# Patient Record
Sex: Male | Born: 1950 | Race: White | Hispanic: No | Marital: Single | Smoking: Current every day smoker
Health system: Southern US, Community
[De-identification: ages and names within clinical notes are randomized; demographics above are authoritative.]

## PROBLEM LIST (undated history)

## (undated) DIAGNOSIS — I10 Essential (primary) hypertension: Secondary | ICD-10-CM

## (undated) DIAGNOSIS — M51369 Other intervertebral disc degeneration, lumbar region without mention of lumbar back pain or lower extremity pain: Secondary | ICD-10-CM

## (undated) DIAGNOSIS — F419 Anxiety disorder, unspecified: Secondary | ICD-10-CM

## (undated) DIAGNOSIS — M5136 Other intervertebral disc degeneration, lumbar region: Secondary | ICD-10-CM

## (undated) DIAGNOSIS — T8859XA Other complications of anesthesia, initial encounter: Secondary | ICD-10-CM

## (undated) DIAGNOSIS — E611 Iron deficiency: Secondary | ICD-10-CM

## (undated) DIAGNOSIS — E7800 Pure hypercholesterolemia, unspecified: Secondary | ICD-10-CM

## (undated) DIAGNOSIS — M549 Dorsalgia, unspecified: Secondary | ICD-10-CM

## (undated) DIAGNOSIS — K759 Inflammatory liver disease, unspecified: Secondary | ICD-10-CM

## (undated) DIAGNOSIS — M5430 Sciatica, unspecified side: Secondary | ICD-10-CM

## (undated) HISTORY — PX: JOINT REPLACEMENT: SHX530

## (undated) HISTORY — PX: TOTAL HIP ARTHROPLASTY: SHX124

---

## 2013-09-30 ENCOUNTER — Ambulatory Visit: Payer: Self-pay | Admitting: General Practice

## 2013-10-21 ENCOUNTER — Ambulatory Visit: Payer: Self-pay | Admitting: General Practice

## 2013-10-21 LAB — BASIC METABOLIC PANEL
Anion Gap: 4 — ABNORMAL LOW (ref 7–16)
BUN: 16 mg/dL (ref 7–18)
CO2: 36 mmol/L — AB (ref 21–32)
Calcium, Total: 8.9 mg/dL (ref 8.5–10.1)
Chloride: 93 mmol/L — ABNORMAL LOW (ref 98–107)
Creatinine: 0.84 mg/dL (ref 0.60–1.30)
EGFR (African American): >60
EGFR (Non-African Amer.): >60
Glucose: 121 mg/dL — ABNORMAL HIGH (ref 65–99)
OSMOLALITY: 269 (ref 275–301)
Potassium: 3.2 mmol/L — ABNORMAL LOW (ref 3.5–5.1)
SODIUM: 133 mmol/L — AB (ref 136–145)

## 2013-10-21 LAB — URINALYSIS, COMPLETE
BLOOD: NEGATIVE
Bacteria: NONE SEEN
Bilirubin,UR: NEGATIVE
Glucose,UR: NEGATIVE mg/dL (ref 0–75)
Hyaline Cast: 1
Ketone: NEGATIVE
Leukocyte Esterase: NEGATIVE
NITRITE: NEGATIVE
PROTEIN: NEGATIVE
Ph: 6 (ref 4.5–8.0)
RBC,UR: 1 /HPF (ref 0–5)
Specific Gravity: 1.014 (ref 1.003–1.030)
Squamous Epithelial: NONE SEEN
WBC UR: 1 /HPF (ref 0–5)

## 2013-10-21 LAB — SEDIMENTATION RATE: ERYTHROCYTE SED RATE: 22 mm/h — AB (ref 0–20)

## 2013-10-21 LAB — CBC
HCT: 43.7 % (ref 40.0–52.0)
HGB: 14.5 g/dL (ref 13.0–18.0)
MCH: 34.8 pg — ABNORMAL HIGH (ref 26.0–34.0)
MCHC: 33.2 g/dL (ref 32.0–36.0)
MCV: 105 fL — ABNORMAL HIGH (ref 80–100)
PLATELETS: 275 10*3/uL (ref 150–440)
RBC: 4.17 10*6/uL — AB (ref 4.40–5.90)
RDW: 14.1 % (ref 11.5–14.5)
WBC: 5.6 10*3/uL (ref 3.8–10.6)

## 2013-10-21 LAB — PROTIME-INR
INR: 0.9
PROTHROMBIN TIME: 12 s (ref 11.5–14.7)

## 2013-10-21 LAB — APTT: ACTIVATED PTT: 33.3 s (ref 23.6–35.9)

## 2013-10-21 LAB — MRSA PCR SCREENING

## 2013-10-22 LAB — URINE CULTURE

## 2013-11-06 ENCOUNTER — Inpatient Hospital Stay: Payer: Self-pay | Admitting: General Practice

## 2013-11-06 LAB — POTASSIUM: Potassium: 3.1 mmol/L — ABNORMAL LOW (ref 3.5–5.1)

## 2013-11-07 LAB — BASIC METABOLIC PANEL
BUN: 8 mg/dL (ref 7–18)
CO2: 31 mmol/L (ref 21–32)
CREATININE: 0.68 mg/dL (ref 0.60–1.30)
Calcium, Total: 8.6 mg/dL (ref 8.5–10.1)
Chloride: 103 mmol/L (ref 98–107)
EGFR (African American): >60
EGFR (Non-African Amer.): >60
Glucose: 111 mg/dL — ABNORMAL HIGH (ref 65–99)
OSMOLALITY: 264 (ref 275–301)
Potassium: 3.5 mmol/L (ref 3.5–5.1)
SODIUM: 132 mmol/L — AB (ref 136–145)

## 2013-11-07 LAB — HEMOGLOBIN: HGB: 11.4 g/dL — AB (ref 13.0–18.0)

## 2013-11-07 LAB — PLATELET COUNT: Platelet: 243 10*3/uL (ref 150–440)

## 2013-11-08 LAB — HEMOGLOBIN: HGB: 11.1 g/dL — ABNORMAL LOW (ref 13.0–18.0)

## 2013-11-08 LAB — BASIC METABOLIC PANEL
Anion Gap: 4 — ABNORMAL LOW (ref 7–16)
BUN: 10 mg/dL (ref 7–18)
CREATININE: 0.8 mg/dL (ref 0.60–1.30)
Calcium, Total: 8.4 mg/dL — ABNORMAL LOW (ref 8.5–10.1)
Chloride: 100 mmol/L (ref 98–107)
Co2: 33 mmol/L — ABNORMAL HIGH (ref 21–32)
Glucose: 110 mg/dL — ABNORMAL HIGH (ref 65–99)
Osmolality: 274 (ref 275–301)
Potassium: 3.2 mmol/L — ABNORMAL LOW (ref 3.5–5.1)
Sodium: 137 mmol/L (ref 136–145)

## 2013-11-08 LAB — PLATELET COUNT: PLATELETS: 225 10*3/uL (ref 150–440)

## 2013-11-09 LAB — BASIC METABOLIC PANEL
ANION GAP: 6 — AB (ref 7–16)
BUN: 8 mg/dL (ref 7–18)
CALCIUM: 9.1 mg/dL (ref 8.5–10.1)
CHLORIDE: 98 mmol/L (ref 98–107)
CREATININE: 0.84 mg/dL (ref 0.60–1.30)
Co2: 30 mmol/L (ref 21–32)
EGFR (Non-African Amer.): >60
GLUCOSE: 98 mg/dL (ref 65–99)
Osmolality: 267 (ref 275–301)
POTASSIUM: 3.9 mmol/L (ref 3.5–5.1)
SODIUM: 134 mmol/L — AB (ref 136–145)

## 2013-11-11 LAB — PATHOLOGY REPORT

## 2014-05-21 ENCOUNTER — Ambulatory Visit: Payer: Self-pay | Admitting: Internal Medicine

## 2014-06-28 ENCOUNTER — Ambulatory Visit: Payer: Self-pay | Admitting: Gastroenterology

## 2014-10-30 NOTE — Discharge Summary (Signed)
PATIENT NAME:  Todd Beard, Todd Beard MR#:  [id number] DATE OF BIRTH:  11-27-1950  DATE OF ADMISSION:  11/06/2013 DATE OF DISCHARGE:  11/09/2013  ADMITTING DIAGNOSIS: Degenerative arthrosis of the left hip.   DISCHARGE DIAGNOSIS: Degenerative arthrosis of the right knee.   HISTORY: The patient is a 64 year old who has been followed at [facility] for progression of left hip and groin pain. The pain has become relatively constant in nature and was noted be worse with weight-bearing activities. The patient had appreciated significant decrease in his range of motion. At the time of surgery, he was using a quad cane for ambulation due to the severity of pain. He had gotten to the point where he was not seeing any appreciative benefits from diclofenac or tramadol and states that he was actually requiring hydrocodone for pain management. The patient states that the pain had progressed to the point that it was significantly interfering with his activities of daily living. X-rays taken in [facility] showed severe erosion changes noted to the left hip with collapse in the femoral head. After discussion of the risks and benefits of surgical intervention, the patient expressed his understanding of the risks and benefits and agreed for plans for surgical intervention.   HOSPITAL COURSE:   PROCEDURE: Left total hip arthroplasty.   ANESTHESIA: Spinal.   IMPLANTS UTILIZED: DePuy 15 mm small stature AML femoral stem, 56 mm outer diameter Pinnacle Gription sector acetabular shell, two 6.5 mm Pinnacle cancellus screws (20 mm and 25 mm length), neutral Pinnacle Marathon polyethylene liner, and a 36 mm M-Spec femoral head with a -2 mm neck length.   HOSPITAL COURSE: The patient tolerated the procedure very well. He had no complications. He was then taken to the PAC-U where he was stabilized and then transferred to the orthopedic floor. The patient began receiving anticoagulation therapy of Lovenox 30 mg  subcutaneous every 12 hours per anesthesia and pharmacy protocol. He was fitted with TED stockings bilaterally. These were allowed to be removed one hour per eight hour shift. The patient was also fitted with AVI compression foot pumps bilaterally set at 80 mmHg. His calves have been nontender. There has been no evidence of any DVTs. Heels were elevated off the bed using rolled towels.   The patient has denied any chest pain or shortness of breath. Vital signs have been stable. He has been afebrile. Hemodynamically,  he was stable. No transfusions were given.   Physical therapy was initiated on day one for gait training and transfers. He has done very well. Upon being discharged, he was ambulating greater than 200 feet. Was independent with bed to chair transfers. Was able go up and down four sets of steps. Occupational therapy was also initiated on day one for activities of daily living and assistive devices. Overall, unremarkable hospital course. He has done extremely well.   The patient's IV, Foley and Hemovac were discontinued on day two along with a dressing change. The wound was free of any drainage or any signs of infection.   DISPOSITION: The patient is being discharged to home in improved stable condition.   DISCHARGE INSTRUCTIONS: He may continue to weight bear as tolerated. Continue with TED stockings bilaterally. These are allowed to be removed at night but are to be worn during the day. Elevate the heels off the bed. Incentive spirometer q.1 while awake. Encourage the patient to continue cough and deep breathing q.2 hours while awake. He is placed on a regular diet. He is instructed in  wound care. He is not to get the dressing wet until staples are removed on 11/20/2013. Physical therapy will remove the staples on this date and apply benzoin  and Steri-Strips. He has a follow-up appointment with [facility] on 06/16 at 9:30. He is to call the clinic sooner if any temperatures of 101.5 or  greater. He was once again gone over the posterior hip precautions. Continue using a walker until cleared by physical therapy to go to a quad cane.   DRUG ALLERGIES: ATENOLOL AND METOPROLOL.   The patient is to resume his regular medications that he was on prior to admission. He was given a prescription for oxycodone 5 to 10 mg q.4-6h.  p.r.n. for pain, tramadol 50 to 100 mg q.4-6h. p.r.n. for pain, and Lovenox 40 mg subcutaneously daily for 14 days, then discontinue and begin taking  one 81 mg enteric-coated aspirin.   PAST MEDICAL HISTORY: Hypertension.  ____________________________ Vance Peper, PA jrw:sg D: 11/09/2013 07:02:55 ET T: 11/09/2013 09:16:07 ET JOB#: [id number]  cc: Vance Peper, PA, <Dictator> JON WOLFE PA ELECTRONICALLY SIGNED 11/10/2013 21:42

## 2014-10-30 NOTE — Op Note (Signed)
PATIENT NAME:  Todd Beard, Todd Beard MR#:  [id number] DATE OF BIRTH:  [birth year]  DATE OF PROCEDURE:  11/06/2013  PREOPERATIVE DIAGNOSIS: Severe degenerative arthrosis of the left hip.   POSTOPERATIVE DIAGNOSIS: Severe degenerative arthrosis of the left hip.   PROCEDURE PERFORMED: Left total hip arthroplasty.   SURGEON: Laurice Record. Holley Bouche., MD  ASSISTANT: Vance Peper, PA (required to maintain retraction throughout the procedure).   ANESTHESIA: Spinal.   ESTIMATED BLOOD LOSS: 500 mL.   FLUIDS REPLACED: 1200 mL of crystalloid.   DRAINS: Two medium drains to Hemovac reservoir.   IMPLANTS UTILIZED: DePuy 15 mm small stature AML femoral stem, 56 mm outer diameter Pinnacle Gription Sector acetabular shell, two 6.5 mm Pinnacle cancellous bone screws (20 mm and 25 mm length), neutral Pinnacle Marathon polyethylene liner, and 36 mm M-SPEC femoral head with a -2 mm neck length.   INDICATIONS FOR SURGERY: The patient is a 64 year old male who has been seen for complaints of progressive left hip and groin pain with limb length inequality. X-rays demonstrated severe degenerative changes with collapse of the femoral head and erosion of the acetabulum. After discussion of the risks and benefits of surgical intervention, the patient expressed understanding of the risks and benefits and agreed with plans for surgical intervention.   PROCEDURE IN DETAIL: The patient was brought into the operating room and, after adequate spinal anesthesia was achieved, the patient was placed in right lateral decubitus position. Axillary roll was placed, and all bony prominences were well padded. The patient's left hip and leg were cleaned and prepped with alcohol and DuraPrep and draped in the usual sterile fashion. A "timeout" was performed as per usual protocol. A lateral curvilinear incision was made gently curving towards the posterior superior iliac spine. IT band was incised in line with the skin incisions. Fibers of the  gluteus maximus were split in line. Piriformis tendon was identified, skeletonized, incised at its insertion in the proximal femur and reflected posteriorly. In a similar fashion, the short external rotators were incised and reflected posteriorly. A T-type posterior capsulotomy was performed. Prior to dislocation of the femoral head, a threaded Steinmann pin was inserted through a separate stab incision into the pelvis superior to the acetabulum and bent in the form of a stylus so as to assess limb length and hip offset throughout the procedure. The femoral head was then dislocated posteriorly. Inspection of the head demonstrated severe degenerative changes with gross collapse and irregularity of the remaining portion of the articular surface. The femoral neck cut was performed using an oscillating saw. The anterior capsule was elevated off of the femoral neck. Inspection of the acetabulum demonstrated gross irregularity with fibrotic tissue within the recess of the acetabulum. The labrum as well as the fibrotic tissue within the acetabulum was debrided and excised using electrocautery. The acetabulum was then carefully reamed in a sequential fashion up to a 55 mm diameter. This allowed for good punctate bleeding bone as well as a relatively solid surface. A 56 mm outer diameter Pinnacle Gription Sector acetabular shell was positioned and impacted into place. Good scratch fit was appreciated. Additional stabilization of the cup was performed using insertion of two 6.5 mm cancellous screws. A neutral polyethylene liner was inserted, and attention was directed to the proximal femur. Pilot hole for reaming of the proximal femoral canal was created using a high-speed bur. Proximal femoral canal was reamed in a sequential fashion using serial reamers up to a 14.5 mm diameter. This allowed for approximately 5.5  to 6 cm of scratch fit. The proximal femur was then further prepared using a 50 mm aggressive side-biting  reamer. Serial broaches were inserted up to a 15 mm small stature broach. The calcar region was planed, and trial reduction was attempted with first a 1.5 and subsequently a -2 mm neck length. It was felt that the tension was too tight to provide for appropriate reduction, and it was thus elected to countersink the broach and remove additional bone in the calcar region. This was performed, and the calcar region was planed. Trial reduction was performed with a 36 mm hip ball with a -2 mm neck length. This allowed for excellent stability both anteriorly and posteriorly and for appropriate hip offset and good equalization of limb lengths. The trial components were removed. The acetabular shell was irrigated with copious amounts of normal saline with antibiotic solution and then suctioned dry. A neutral Pinnacle Marathon polyethylene insert was positioned and impacted into place. Next, a 15 mm small stature AML femoral component was positioned and impacted into place. Excellent scratch fit was appreciated. The Morse taper was cleaned and dried. A 36 mm M-SPEC femoral head with a -2 mm neck length was placed on the trunnion and impacted into place. The hip was reduced and placed through a range of motion. Excellent range of motion was appreciated both anteriorly and posteriorly. Good equalization of limb lengths was appreciated and good hip offset was noted. The sciatic nerve was palpated and was felt to have no excessive tension applied in positions of flexion or extension.   The wound was irrigated with copious amounts of normal saline with antibiotic solution using pulsatile lavage and then suctioned dry. The posterior capsulotomy was repaired using #5 Ethibond. The piriformis tendon was reapproximated on the surface of the gluteus medius tendon using #5 Ethibond. The gluteal sling was repaired using interrupted sutures of #5 Ethibond. Two medium drains were placed in the wound bed and brought out through separate  stab incision to be attached to a Hemovac reservoir. IT band was repaired using interrupted sutures of #1 Vicryl. The subcutaneous tissue was approximated in layers using first #0 Vicryl, followed by 2-0 Vicryl. Skin was closed with skin staples. A sterile dressing was applied.   The patient tolerated the procedure well. He was transported to the recovery room in stable condition.    ____________________________ Laurice Record. Holley Bouche., MD jph:lb D: 11/06/2013 13:12:52 ET T: 11/06/2013 13:31:07 ET JOB#: [id number]  cc: Jeneen Rinks P. Holley Bouche., MD, <Dictator> JAMES P Holley Bouche MD ELECTRONICALLY SIGNED 11/15/2013 23:08

## 2014-11-18 ENCOUNTER — Emergency Department: Payer: No Typology Code available for payment source

## 2014-11-18 ENCOUNTER — Emergency Department
Admission: EM | Admit: 2014-11-18 | Discharge: 2014-11-18 | Disposition: A | Discharge status: Home / Self Care | Payer: No Typology Code available for payment source | Attending: Emergency Medicine | Admitting: Emergency Medicine

## 2014-11-18 DIAGNOSIS — Y93E1 Activity, personal bathing and showering: Secondary | ICD-10-CM | POA: Diagnosis not present

## 2014-11-18 DIAGNOSIS — Y9289 Other specified places as the place of occurrence of the external cause: Secondary | ICD-10-CM | POA: Insufficient documentation

## 2014-11-18 DIAGNOSIS — I10 Essential (primary) hypertension: Secondary | ICD-10-CM | POA: Insufficient documentation

## 2014-11-18 DIAGNOSIS — Y998 Other external cause status: Secondary | ICD-10-CM | POA: Diagnosis not present

## 2014-11-18 DIAGNOSIS — Z7982 Long term (current) use of aspirin: Secondary | ICD-10-CM | POA: Insufficient documentation

## 2014-11-18 DIAGNOSIS — Z720 Tobacco use: Secondary | ICD-10-CM | POA: Diagnosis not present

## 2014-11-18 DIAGNOSIS — W01198A Fall on same level from slipping, tripping and stumbling with subsequent striking against other object, initial encounter: Secondary | ICD-10-CM | POA: Insufficient documentation

## 2014-11-18 DIAGNOSIS — Z79899 Other long term (current) drug therapy: Secondary | ICD-10-CM | POA: Diagnosis not present

## 2014-11-18 DIAGNOSIS — S299XXA Unspecified injury of thorax, initial encounter: Secondary | ICD-10-CM | POA: Diagnosis present

## 2014-11-18 DIAGNOSIS — T1490XA Injury, unspecified, initial encounter: Secondary | ICD-10-CM

## 2014-11-18 DIAGNOSIS — S2241XA Multiple fractures of ribs, right side, initial encounter for closed fracture: Secondary | ICD-10-CM | POA: Insufficient documentation

## 2014-11-18 HISTORY — DX: Essential (primary) hypertension: I10

## 2014-11-18 LAB — COMPREHENSIVE METABOLIC PANEL
ALBUMIN: 3.7 g/dL (ref 3.5–5.0)
ALK PHOS: 218 U/L — AB (ref 38–126)
ALT: 48 U/L (ref 17–63)
AST: 128 U/L — ABNORMAL HIGH (ref 15–41)
Anion gap: 13 (ref 5–15)
BUN: 14 mg/dL (ref 6–20)
CALCIUM: 9.8 mg/dL (ref 8.9–10.3)
CO2: 28 mmol/L (ref 22–32)
Chloride: 95 mmol/L — ABNORMAL LOW (ref 101–111)
Creatinine, Ser: 1.09 mg/dL (ref 0.61–1.24)
GFR calc Af Amer: >60 mL/min (ref >60)
Glucose, Bld: 132 mg/dL — ABNORMAL HIGH (ref 65–99)
POTASSIUM: 4.2 mmol/L (ref 3.5–5.1)
SODIUM: 136 mmol/L (ref 135–145)
TOTAL PROTEIN: 8.7 g/dL — AB (ref 6.5–8.1)
Total Bilirubin: 1.4 mg/dL — ABNORMAL HIGH (ref 0.3–1.2)

## 2014-11-18 LAB — CBC WITH DIFFERENTIAL/PLATELET
BASOS ABS: 0.1 10*3/uL (ref 0–0.1)
Basophils Relative: 1 %
Eosinophils Absolute: 0 10*3/uL (ref 0–0.7)
Eosinophils Relative: 0 %
HCT: 47.1 % (ref 40.0–52.0)
HEMOGLOBIN: 15.6 g/dL (ref 13.0–18.0)
LYMPHS PCT: 12 %
Lymphs Abs: 1.9 10*3/uL (ref 1.0–3.6)
MCH: 34.5 pg — ABNORMAL HIGH (ref 26.0–34.0)
MCHC: 33.2 g/dL (ref 32.0–36.0)
MCV: 103.7 fL — ABNORMAL HIGH (ref 80.0–100.0)
Monocytes Absolute: 1.5 10*3/uL — ABNORMAL HIGH (ref 0.2–1.0)
Monocytes Relative: 10 %
NEUTROS ABS: 12.2 10*3/uL — AB (ref 1.4–6.5)
NEUTROS PCT: 77 %
PLATELETS: 318 10*3/uL (ref 150–440)
RBC: 4.54 MIL/uL (ref 4.40–5.90)
RDW: 16.2 % — AB (ref 11.5–14.5)
WBC: 15.7 10*3/uL — ABNORMAL HIGH (ref 3.8–10.6)

## 2014-11-18 LAB — LIPASE, BLOOD: Lipase: 32 U/L (ref 22–51)

## 2014-11-18 MED ORDER — ONDANSETRON HCL 4 MG/2ML IJ SOLN
INTRAMUSCULAR | Status: AC
  Administered 2014-11-18: 4 mg via INTRAVENOUS
  Filled 2014-11-18: qty 2

## 2014-11-18 MED ORDER — IOHEXOL 350 MG/ML SOLN
100.0000 mL | Freq: Once | INTRAVENOUS | Status: AC | PRN
  Administered 2014-11-18: 100 mL via INTRAVENOUS
  Filled 2014-11-18: qty 100

## 2014-11-18 MED ORDER — MORPHINE SULFATE 4 MG/ML IJ SOLN
INTRAMUSCULAR | Status: AC
  Administered 2014-11-18: 4 mg via INTRAVENOUS
  Filled 2014-11-18: qty 1

## 2014-11-18 MED ORDER — HYDROCODONE-ACETAMINOPHEN 5-325 MG PO TABS
ORAL_TABLET | ORAL | Status: AC
  Filled 2014-11-18: qty 1

## 2014-11-18 MED ORDER — HYDROCODONE-ACETAMINOPHEN 5-325 MG PO TABS: 1.0000 | ORAL_TABLET | Freq: Four times a day (QID) | ORAL | Status: DC | PRN

## 2014-11-18 MED ORDER — HYDROCODONE-ACETAMINOPHEN 5-325 MG PO TABS
1.0000 | ORAL_TABLET | Freq: Once | ORAL | Status: AC
  Administered 2014-11-18: 1 via ORAL

## 2014-11-18 MED ORDER — MORPHINE SULFATE 4 MG/ML IJ SOLN
4.0000 mg | Freq: Once | INTRAMUSCULAR | Status: AC
  Administered 2014-11-18: 4 mg via INTRAVENOUS

## 2014-11-18 MED ORDER — ONDANSETRON HCL 4 MG/2ML IJ SOLN
4.0000 mg | INTRAMUSCULAR | Status: AC
  Administered 2014-11-18: 4 mg via INTRAVENOUS

## 2014-11-18 NOTE — ED Notes (Signed)
Pt sent from [facility] with 3 fractured ribs on the right for possible observations.the patient states he slipped in the shower this morning and injured his right ribs.the patient is a/ox4, respirations wnl.the patient is in NAD on arrival..

## 2014-11-18 NOTE — ED Notes (Signed)
Patient transported to CT 

## 2014-11-18 NOTE — Discharge Instructions (Signed)
As we discussed, you have 3 rib fractures on the right side of your chest.  We did not identify any other injuries on your CT scan.  Please take your pain medicine as needed and use the incentive spirometer as instructed to make sure you continue taking nice deep breaths.  Follow-up with Dr. Doy Hutching next week for a follow-up appointment, or return immediately to the emergency department if you develop new or worsening symptoms.  Rib Fracture A rib fracture is a break or crack in one of the bones of the ribs. The ribs are a group of long, curved bones that wrap around your chest and attach to your spine. They protect your lungs and other organs in the chest cavity. A broken or cracked rib is often painful, but most do not cause other problems. Most rib fractures heal on their own over time. However, rib fractures can be more serious if multiple ribs are broken or if broken ribs move out of place and push against other structures. CAUSES   A direct blow to the chest. For example, this could happen during contact sports, a car accident, or a fall against a hard object.  Repetitive movements with high force, such as pitching a baseball or having severe coughing spells. SYMPTOMS   Pain when you breathe in or cough.  Pain when someone presses on the injured area. DIAGNOSIS  Your caregiver will perform a physical exam. Various imaging tests may be ordered to confirm the diagnosis and to look for related injuries. These tests may include a chest X-ray, computed tomography (CT), magnetic resonance imaging (MRI), or a bone scan. TREATMENT  Rib fractures usually heal on their own in 1-3 months. The longer healing period is often associated with a continued cough or other aggravating activities. During the healing period, pain control is very important. Medication is usually given to control pain. Hospitalization or surgery may be needed for more severe injuries, such as those in which multiple ribs are broken  or the ribs have moved out of place.  HOME CARE INSTRUCTIONS   Avoid strenuous activity and any activities or movements that cause pain. Be careful during activities and avoid bumping the injured rib.  Gradually increase activity as directed by your caregiver.  Only take over-the-counter or prescription medications as directed by your caregiver. Do not take other medications without asking your caregiver first.  Apply ice to the injured area for the first 1-2 days after you have been treated or as directed by your caregiver. Applying ice helps to reduce inflammation and pain.  Put ice in a plastic bag.  Place a towel between your skin and the bag.   Leave the ice on for 15-20 minutes at a time, every 2 hours while you are awake.  Perform deep breathing as directed by your caregiver. This will help prevent pneumonia, which is a common complication of a broken rib. Your caregiver may instruct you to:  Take deep breaths several times a day.  Try to cough several times a day, holding a pillow against the injured area.  Use a device called an incentive spirometer to practice deep breathing several times a day.  Drink enough fluids to keep your urine clear or pale yellow. This will help you avoid constipation.   Do not wear a rib belt or binder. These restrict breathing, which can lead to pneumonia.  SEEK IMMEDIATE MEDICAL CARE IF:   You have a fever.   You have difficulty breathing or shortness of  breath.   You develop a continual cough, or you cough up thick or bloody sputum.  You feel sick to your stomach (nausea), throw up (vomit), or have abdominal pain.   You have worsening pain not controlled with medications.  MAKE SURE YOU:  Understand these instructions.  Will watch your condition.  Will get help right away if you are not doing well or get worse. Document Released: 06/25/2005 Document Revised: 02/25/2013 Document Reviewed: 08/27/2012 [facility] Patient  Information 2015 [place], [place]. This information is not intended to replace advice given to you by your health care provider. Make sure you discuss any questions you have with your health care provider.

## 2014-11-18 NOTE — ED Provider Notes (Signed)
[facility] Emergency Department Provider Note  ____________________________________________  Time seen: Approximately 5:57 PM  I have reviewed the triage vital signs and the nursing notes.   HISTORY  Chief Complaint Rib Injury    HPI Todd Beard is a 64 y.o. male with no contributory past medical history who presents upon the recommendation of the [facility] acute care.  He went to them for evaluation after falling early this morning in the shower and striking his right upper rib cage.  He denies difficulty breathing but is having persistent pain on the right side of his body.  The chest x-ray, which we unfortunately cannot see in [facility], wasnotable for 3 fractured ribs on the right.  The [facility] recommended that he come to the emergency department for possible 24-hour evaluation.  However, the patient is not in significant pain and is having no difficulty breathing.  He describes the pain as moderate and worse with movement.  Holding still makes it better.   Past Medical History  Diagnosis Date  . Hypertension     There are no active problems to display for this patient.   Past Surgical History  Procedure Laterality Date  . Total hip arthroplasty Left     Current Outpatient Rx  Name  Route  Sig  Dispense  Refill  . ALPRAZolam (XANAX) 1 MG tablet   Oral   Take 1 mg by mouth 2 (two) times daily as needed for anxiety.         Marland Kitchen amLODipine (NORVASC) 5 MG tablet   Oral   Take 5 mg by mouth daily.         Marland Kitchen aspirin 81 MG tablet   Oral   Take 81 mg by mouth daily.         . cyclobenzaprine (FLEXERIL) 10 MG tablet   Oral   Take 10 mg by mouth 3 (three) times daily.         . magnesium oxide (MAG-OX) 400 MG tablet   Oral   Take 400 mg by mouth daily.         . potassium chloride (K-DUR) 10 MEQ tablet   Oral   Take 10 mEq by mouth daily.           Allergies Tramadol  No family history on file.  Social  History History  Substance Use Topics  . Smoking status: Current Every Day Smoker -- 0.50 packs/day    Types: Cigarettes  . Smokeless tobacco: Never Used  . Alcohol Use: Yes     Comment: occasion    Review of Systems Constitutional: No fever/chills Eyes: No visual changes. ENT: No sore throat. Cardiovascular: Denies chest pain. Respiratory: Denies shortness of breath.  Pain in the right side of his chest. Gastrointestinal: No abdominal pain.  No nausea, no vomiting.  No diarrhea.  No constipation. Genitourinary: Negative for dysuria. Musculoskeletal: Negative for back pain. Skin: Negative for rash. Neurological: Negative for headaches, focal weakness or numbness.  10-point ROS otherwise negative.  ____________________________________________   PHYSICAL EXAM:  VITAL SIGNS: ED Triage Vitals  Enc Vitals Group     BP 11/18/14 1513 141/90 mmHg     Pulse Rate 11/18/14 1513 88     Resp 11/18/14 1513 18     Temp 11/18/14 1513 98.7 F (37.1 C)     Temp Source 11/18/14 1513 Oral     SpO2 11/18/14 1513 100 %     Weight 11/18/14 1513 157 lb (71.215 kg)  Height 11/18/14 1513 5\' 6"  (1.676 m)     Head Cir --      Peak Flow --      Pain Score 11/18/14 1514 10     Pain Loc --      Pain Edu? --      Excl. in [place]? --     Constitutional: Alert and oriented. Well appearing and in no acute distress. Eyes: Conjunctivae are normal. PERRL. EOMI. Head: Atraumatic. Nose: No congestion/rhinnorhea. Mouth/Throat: Mucous membranes are moist.  Oropharynx non-erythematous. Neck: No stridor.  No cervical spine tenderness to palpation. Cardiovascular: Normal rate, regular rhythm. Grossly normal heart sounds.  Good peripheral circulation. Respiratory: Normal respiratory effort.  No retractions. Lungs CTAB.  Ecchymosis of the right lateral upper rib cage with tenderness to palpation. Gastrointestinal: Soft but tender to palpation of the right upper quadrant with guarding. No distention. No  abdominal bruits. No CVA tenderness. Musculoskeletal: No lower extremity tenderness nor edema.  No joint effusions. Neurologic:  Normal speech and language. No gross focal neurologic deficits are appreciated. Speech is normal. No gait instability. Skin:  Skin is warm, dry and intact. No rash noted. Psychiatric: Mood and affect are normal. Speech and behavior are normal.  ____________________________________________   LABS (all labs ordered are listed, but only abnormal results are displayed)  Labs Reviewed  COMPREHENSIVE METABOLIC PANEL - Abnormal; Notable for the following:    Chloride 95 (*)    Glucose, Bld 132 (*)    Total Protein 8.7 (*)    AST 128 (*)    Alkaline Phosphatase 218 (*)    Total Bilirubin 1.4 (*)    All other components within normal limits  CBC WITH DIFFERENTIAL/PLATELET - Abnormal; Notable for the following:    WBC 15.7 (*)    MCV 103.7 (*)    MCH 34.5 (*)    RDW 16.2 (*)    Neutro Abs 12.2 (*)    Monocytes Absolute 1.5 (*)    All other components within normal limits  LIPASE, BLOOD   ____________________________________________  EKG  Not indicated ____________________________________________  RADIOLOGY  Ct Chest W Contrast  11/18/2014   CLINICAL DATA:  [place] in the shower this morning.  Right rib pain.  EXAM: CT CHEST, ABDOMEN, AND PELVIS WITH CONTRAST  TECHNIQUE: Multidetector CT imaging of the chest, abdomen and pelvis was performed following the standard protocol during bolus administration of intravenous contrast.  CONTRAST:  161mL OMNIPAQUE IOHEXOL 350 MG/ML SOLN  COMPARISON:  None.  FINDINGS: CT CHEST FINDINGS  There are acute minimally displaced fractures of the right sixth and seventh and eighth ribs. There is old fracture deformity of the right ninth and tenth ribs. There is old fracture deformity of the left third rib there are mild linear atelectatic appearing opacities in the right lung base. There is no pneumothorax. There is no effusion.  Mediastinum and great vessels are intact. Thoracic aorta is normal in caliber and intact.  CT ABDOMEN AND PELVIS FINDINGS  There is diffuse fatty infiltration of the liver without focal lesion. The liver is moderately enlarged. The spleen is normal in size and unremarkable. Gallbladder and bile ducts are unremarkable.  There are normal appearances of the pancreas. There is a 11 x 16 mm left adrenal nodule with benign appearances. The kidneys, ureters and bladder appear unremarkable.  The abdominal aorta is normal in caliber with moderate atherosclerotic calcification.  Stomach and small bowel appear normal. There is extensive colonic diverticulosis.  There is no peritoneal blood or free  air.  There is mild metal artifact from a left total hip arthroplasty. Skeletal structures appear intact.  IMPRESSION: 1. Acute fractures of the right sixth, seventh and eighth ribs. 2. Old chronic fracture deformities of the right ninth and tenth ribs, as well as the left third rib. 3. Mild linear atelectatic appearing right lung base opacities 4. Enlarged fatty liver 5. 11 x 16 mm left adrenal nodule with benign appearances. Consider a follow-up scan in 12 months to assess stability. This recommendation follows ACR consensus guidelines: Managing Incidental Findings on Abdominal CT: White Paper of the ACR Incidental Findings Committee. Joellyn Rued Radiol [phone]   Electronically Signed   By: Andreas Newport M.D.   On: 11/18/2014 20:04   Ct Abdomen Pelvis W Contrast  11/18/2014   CLINICAL DATA:  [place] in the shower this morning.  Right rib pain.  EXAM: CT CHEST, ABDOMEN, AND PELVIS WITH CONTRAST  TECHNIQUE: Multidetector CT imaging of the chest, abdomen and pelvis was performed following the standard protocol during bolus administration of intravenous contrast.  CONTRAST:  126mL OMNIPAQUE IOHEXOL 350 MG/ML SOLN  COMPARISON:  None.  FINDINGS: CT CHEST FINDINGS  There are acute minimally displaced fractures of the right sixth  and seventh and eighth ribs. There is old fracture deformity of the right ninth and tenth ribs. There is old fracture deformity of the left third rib there are mild linear atelectatic appearing opacities in the right lung base. There is no pneumothorax. There is no effusion. Mediastinum and great vessels are intact. Thoracic aorta is normal in caliber and intact.  CT ABDOMEN AND PELVIS FINDINGS  There is diffuse fatty infiltration of the liver without focal lesion. The liver is moderately enlarged. The spleen is normal in size and unremarkable. Gallbladder and bile ducts are unremarkable.  There are normal appearances of the pancreas. There is a 11 x 16 mm left adrenal nodule with benign appearances. The kidneys, ureters and bladder appear unremarkable.  The abdominal aorta is normal in caliber with moderate atherosclerotic calcification.  Stomach and small bowel appear normal. There is extensive colonic diverticulosis.  There is no peritoneal blood or free air.  There is mild metal artifact from a left total hip arthroplasty. Skeletal structures appear intact.  IMPRESSION: 1. Acute fractures of the right sixth, seventh and eighth ribs. 2. Old chronic fracture deformities of the right ninth and tenth ribs, as well as the left third rib. 3. Mild linear atelectatic appearing right lung base opacities 4. Enlarged fatty liver 5. 11 x 16 mm left adrenal nodule with benign appearances. Consider a follow-up scan in 12 months to assess stability. This recommendation follows ACR consensus guidelines: Managing Incidental Findings on Abdominal CT: White Paper of the ACR Incidental Findings Committee. Joellyn Rued Radiol [phone]   Electronically Signed   By: Andreas Newport M.D.   On: 11/18/2014 20:04    ____________________________________________   PROCEDURES  Procedure(s) performed: None  Critical Care performed: No  ____________________________________________   INITIAL IMPRESSION / ASSESSMENT AND PLAN  / ED COURSE  Pertinent labs & imaging results that were available during my care of the patient were reviewed by me and considered in my medical decision making (see chart for details).  Given the tenderness to palpation both of the rib cage but also of the right upper quadrant, I will evaluate with CT chest and CT abdomen/pelvis to make sure the patient does not have a pulmonary contusion nor any liver injury.  He understands and agrees with  the plan  ----------------------------------------- 8:37 PM on 11/18/2014 -----------------------------------------  The patient has been ambulating around the emergency department without difficulty.  His CT scan were notable only for 3 acute rib fractures but no organ injury or peritoneal blood.  I gave the patient my usual and customary return precautions and he will follow-up with his PCP next week. ____________________________________________   FINAL CLINICAL IMPRESSION(S) / ED DIAGNOSES  Final diagnoses:  Trauma  Multiple rib fractures, right, closed, initial encounter     Hinda Kehr, MD 11/18/14 2042

## 2015-01-31 ENCOUNTER — Ambulatory Visit: Payer: No Typology Code available for payment source | Admitting: Oncology

## 2015-03-02 ENCOUNTER — Inpatient Hospital Stay: Payer: No Typology Code available for payment source

## 2015-03-02 ENCOUNTER — Inpatient Hospital Stay: Payer: No Typology Code available for payment source | Attending: Oncology | Admitting: Oncology

## 2015-03-02 VITALS — BP 137/92 | HR 93 | Temp 96.5°F | Resp 20 | Wt 161.6 lb

## 2015-03-02 DIAGNOSIS — D509 Iron deficiency anemia, unspecified: Secondary | ICD-10-CM | POA: Diagnosis not present

## 2015-03-02 DIAGNOSIS — Z7982 Long term (current) use of aspirin: Secondary | ICD-10-CM | POA: Insufficient documentation

## 2015-03-02 DIAGNOSIS — Z79899 Other long term (current) drug therapy: Secondary | ICD-10-CM | POA: Insufficient documentation

## 2015-03-02 DIAGNOSIS — F17210 Nicotine dependence, cigarettes, uncomplicated: Secondary | ICD-10-CM | POA: Diagnosis not present

## 2015-03-02 DIAGNOSIS — I10 Essential (primary) hypertension: Secondary | ICD-10-CM | POA: Insufficient documentation

## 2015-03-02 LAB — IRON AND TIBC
Iron: 30 ug/dL — ABNORMAL LOW (ref 45–182)
Saturation Ratios: 6 % — ABNORMAL LOW (ref 17.9–39.5)
TIBC: 509 ug/dL — AB (ref 250–450)
UIBC: 479 ug/dL

## 2015-03-02 LAB — CBC
HCT: 34.4 % — ABNORMAL LOW (ref 40.0–52.0)
Hemoglobin: 11.3 g/dL — ABNORMAL LOW (ref 13.0–18.0)
MCH: 28.5 pg (ref 26.0–34.0)
MCHC: 32.8 g/dL (ref 32.0–36.0)
MCV: 86.9 fL (ref 80.0–100.0)
PLATELETS: 385 10*3/uL (ref 150–440)
RBC: 3.96 MIL/uL — AB (ref 4.40–5.90)
RDW: 20.6 % — ABNORMAL HIGH (ref 11.5–14.5)
WBC: 8 10*3/uL (ref 3.8–10.6)

## 2015-03-02 LAB — LACTATE DEHYDROGENASE: LDH: 96 U/L — AB (ref 98–192)

## 2015-03-02 LAB — VITAMIN B12: VITAMIN B 12: 564 pg/mL (ref 180–914)

## 2015-03-02 LAB — RETICULOCYTES
RBC.: 3.96 MIL/uL — ABNORMAL LOW (ref 4.40–5.90)
RETIC CT PCT: 1.2 % (ref 0.4–3.1)
Retic Count, Absolute: 47.5 10*3/uL (ref 19.0–183.0)

## 2015-03-02 LAB — DAT, POLYSPECIFIC AHG (ARMC ONLY): Polyspecific AHG test: NEGATIVE

## 2015-03-02 LAB — FOLATE: FOLATE: 3.9 ng/mL — AB (ref >5.9)

## 2015-03-02 LAB — FERRITIN: Ferritin: 24 ng/mL (ref 24–336)

## 2015-03-02 LAB — SEDIMENTATION RATE: SED RATE: 80 mm/h — AB (ref 0–20)

## 2015-03-02 NOTE — Progress Notes (Signed)
Patient here today for initial evaluation regarding anemia. Patient denies any concerns today.

## 2015-03-03 LAB — HAPTOGLOBIN: Haptoglobin: 155 mg/dL (ref 34–200)

## 2015-03-03 LAB — ANA W/REFLEX: Anti Nuclear Antibody(ANA): NEGATIVE

## 2015-03-03 LAB — ERYTHROPOIETIN: Erythropoietin: 29.2 m[IU]/mL — ABNORMAL HIGH (ref 2.6–18.5)

## 2015-03-10 NOTE — Progress Notes (Signed)
[place]  Telephone:(336) [phone] [phone]) [phone]  ID: Todd Beard OB: 1951/04/06  MR#: [id number]  NOM#:[id number]  Patient Care Team: Idelle Crouch, MD as PCP - General (Internal Medicine)  CHIEF COMPLAINT:  Chief Complaint  Patient presents with  . New Evaluation    anemia    INTERVAL HISTORY: Patient is a 64 year old male who was found to have a declining hemoglobin on routine blood work. He currently feels well and is asymptomatic. He denies any weakness or fatigue. He has no neurologic complaints. He denies any recent fevers or illnesses. He denies any chest pain or shortness of breath. He denies any nausea, vomiting, constipation, or diarrhea. He has no melanoma or hematochezia. He is no urinary complaints. Patient feels at his baseline and offers no specific complaints today.  REVIEW OF SYSTEMS:   Review of Systems  Constitutional: Positive for malaise/fatigue.  Respiratory: Negative.   Cardiovascular: Negative.   Gastrointestinal: Negative.  Negative for blood in stool and melena.  Neurological: Negative for weakness.    As per HPI. Otherwise, a complete review of systems is negatve.  PAST MEDICAL HISTORY: Past Medical History  Diagnosis Date  . Hypertension     PAST SURGICAL HISTORY: Past Surgical History  Procedure Laterality Date  . Total hip arthroplasty Left     FAMILY HISTORY No family history on file.     ADVANCED DIRECTIVES:    HEALTH MAINTENANCE: Social History  Substance Use Topics  . Smoking status: Current Every Day Smoker -- 0.50 packs/day    Types: Cigarettes  . Smokeless tobacco: Never Used  . Alcohol Use: Yes     Comment: occasion     Colonoscopy:  PAP:  Bone density:  Lipid panel:  Allergies  Allergen Reactions  . Tramadol Other (See Comments)    Not metabolized    Current Outpatient Prescriptions  Medication Sig Dispense Refill  . ALPRAZolam (XANAX) 1 MG tablet Take 1 mg by mouth 2 (two)  times daily as needed for anxiety.    . cyclobenzaprine (FLEXERIL) 10 MG tablet Take 10 mg by mouth 3 (three) times daily.    Marland Kitchen amLODipine (NORVASC) 5 MG tablet Take 5 mg by mouth daily.    Marland Kitchen aspirin 81 MG tablet Take 81 mg by mouth daily.    Marland Kitchen HYDROcodone-acetaminophen (NORCO/VICODIN) 5-325 MG per tablet Take 1-2 tablets by mouth every 6 (six) hours as needed for moderate pain or severe pain. (Patient not taking: Reported on 03/02/2015) 20 tablet 0  . magnesium oxide (MAG-OX) 400 MG tablet Take 400 mg by mouth daily.    . potassium chloride (K-DUR) 10 MEQ tablet Take 10 mEq by mouth daily.     No current facility-administered medications for this visit.    OBJECTIVE: Filed Vitals:   03/02/15 0958  BP: 137/92  Pulse: 93  Temp: 96.5 F (35.8 C)  Resp: 20     Body mass index is 26.09 kg/(m^2).    ECOG FS:0 - Asymptomatic  General: Well-developed, well-nourished, no acute distress. Eyes: Pink conjunctiva, anicteric sclera. HEENT: Normocephalic, moist mucous membranes, clear oropharnyx. Lungs: Clear to auscultation bilaterally. Heart: Regular rate and rhythm. No rubs, murmurs, or gallops. Abdomen: Soft, nontender, nondistended. No organomegaly noted, normoactive bowel sounds. Musculoskeletal: No edema, cyanosis, or clubbing. Neuro: Alert, answering all questions appropriately. Cranial nerves grossly intact. Skin: No rashes or petechiae noted. Psych: Normal affect. Lymphatics: No cervical, calvicular, axillary or inguinal LAD.   LAB RESULTS:  Lab Results  Component Value Date  NA 136 11/18/2014   K 4.2 11/18/2014   CL 95* 11/18/2014   CO2 28 11/18/2014   GLUCOSE 132* 11/18/2014   BUN 14 11/18/2014   CREATININE 1.09 11/18/2014   CALCIUM 9.8 11/18/2014   PROT 8.7* 11/18/2014   ALBUMIN 3.7 11/18/2014   AST 128* 11/18/2014   ALT 48 11/18/2014   ALKPHOS 218* 11/18/2014   BILITOT 1.4* 11/18/2014   GFRNONAA >60 11/18/2014   GFRAA >60 11/18/2014    Lab Results  Component  Value Date   WBC 8.0 03/02/2015   NEUTROABS 12.2* 11/18/2014   HGB 11.3* 03/02/2015   HCT 34.4* 03/02/2015   MCV 86.9 03/02/2015   PLT 385 03/02/2015     STUDIES: No results found.  ASSESSMENT: Iron deficiency anemia.  PLAN:    1. Anemia: Patient's hemoglobin is only mildly decreased, but he was found to have decreased iron stores as well as decreased folate level. The remainder of his laboratory work was either negative or within normal limits including B-12 and hemolysis labs. Patient will return to clinic in 2 weeks to discuss his results and consideration of 510 mg IV Feraheme. We will also give patient a prescription for folate supplements at that time. 2. Hyperbilirubinemia: Unclear etiology, no evidence of hemolysis. Monitor.  Patient expressed understanding and was in agreement with this plan. He also understands that He can call clinic at any time with any questions, concerns, or complaints.   Lloyd Huger, MD   03/10/2015 9:10 AM

## 2015-03-16 ENCOUNTER — Inpatient Hospital Stay (HOSPITAL_BASED_OUTPATIENT_CLINIC_OR_DEPARTMENT_OTHER): Payer: No Typology Code available for payment source | Admitting: Oncology

## 2015-03-16 ENCOUNTER — Inpatient Hospital Stay: Payer: No Typology Code available for payment source

## 2015-03-16 ENCOUNTER — Inpatient Hospital Stay: Payer: No Typology Code available for payment source | Attending: Oncology

## 2015-03-16 VITALS — BP 177/92 | HR 99 | Temp 96.2°F | Resp 16 | Wt 160.5 lb

## 2015-03-16 DIAGNOSIS — F17210 Nicotine dependence, cigarettes, uncomplicated: Secondary | ICD-10-CM | POA: Diagnosis not present

## 2015-03-16 DIAGNOSIS — I10 Essential (primary) hypertension: Secondary | ICD-10-CM | POA: Insufficient documentation

## 2015-03-16 DIAGNOSIS — Z79899 Other long term (current) drug therapy: Secondary | ICD-10-CM | POA: Diagnosis not present

## 2015-03-16 DIAGNOSIS — Z96642 Presence of left artificial hip joint: Secondary | ICD-10-CM | POA: Insufficient documentation

## 2015-03-16 DIAGNOSIS — D509 Iron deficiency anemia, unspecified: Secondary | ICD-10-CM | POA: Insufficient documentation

## 2015-03-16 LAB — CBC WITH DIFFERENTIAL/PLATELET
BASOS ABS: 0 10*3/uL (ref 0–0.1)
BASOS PCT: 1 %
EOS ABS: 0.1 10*3/uL (ref 0–0.7)
Eosinophils Relative: 1 %
HCT: 35.3 % — ABNORMAL LOW (ref 40.0–52.0)
HEMOGLOBIN: 11.4 g/dL — AB (ref 13.0–18.0)
Lymphocytes Relative: 21 %
Lymphs Abs: 1.7 10*3/uL (ref 1.0–3.6)
MCH: 27 pg (ref 26.0–34.0)
MCHC: 32.2 g/dL (ref 32.0–36.0)
MCV: 83.7 fL (ref 80.0–100.0)
MONOS PCT: 12 %
Monocytes Absolute: 1 10*3/uL (ref 0.2–1.0)
NEUTROS ABS: 5.2 10*3/uL (ref 1.4–6.5)
NEUTROS PCT: 65 %
Platelets: 359 10*3/uL (ref 150–440)
RBC: 4.21 MIL/uL — AB (ref 4.40–5.90)
RDW: 20.8 % — ABNORMAL HIGH (ref 11.5–14.5)
WBC: 8 10*3/uL (ref 3.8–10.6)

## 2015-03-16 LAB — FOLATE: FOLATE: 3.7 ng/mL — AB (ref >5.9)

## 2015-03-16 MED ORDER — SODIUM CHLORIDE 0.9 % IV SOLN
510.0000 mg | Freq: Once | INTRAVENOUS | Status: AC
  Administered 2015-03-16: 510 mg via INTRAVENOUS
  Filled 2015-03-16: qty 17

## 2015-03-16 MED ORDER — SODIUM CHLORIDE 0.9 % IJ SOLN
10.0000 mL | INTRAMUSCULAR | Status: DC | PRN
  Filled 2015-03-16: qty 10

## 2015-03-16 MED ORDER — FOLIC ACID 1 MG PO TABS: 1.0000 mg | ORAL_TABLET | Freq: Every day | ORAL | Status: DC

## 2015-03-16 MED ORDER — SODIUM CHLORIDE 0.9 % IV SOLN
Freq: Once | INTRAVENOUS | Status: AC
  Administered 2015-03-16: 15:00:00 via INTRAVENOUS
  Filled 2015-03-16: qty 1000

## 2015-03-21 NOTE — Progress Notes (Signed)
[facility] [place]  Telephone:([phone] [phone]  ID: Todd Beard OB: Aug 27, 1950  MR#: [id number]  ZJQ#:[id number]  Patient Care Team: Idelle Crouch, MD as PCP - General (Internal Medicine)  CHIEF COMPLAINT:  Chief Complaint  Patient presents with  . Anemia    INTERVAL HISTORY: Patient returns to clinic today for discussion of his laboratory work and initiation of Feraheme. He continues to feel well and is asymptomatic. He denies any weakness or fatigue. He has no neurologic complaints. He denies any recent fevers or illnesses. He denies any chest pain or shortness of breath. He denies any nausea, vomiting, constipation, or diarrhea. He has no melanoma or hematochezia. He is no urinary complaints. Patient offers no specific complaints today.  REVIEW OF SYSTEMS:   Review of Systems  Constitutional: Positive for malaise/fatigue.  Respiratory: Negative.   Cardiovascular: Negative.   Gastrointestinal: Negative.  Negative for blood in stool and melena.  Neurological: Negative for weakness.    As per HPI. Otherwise, a complete review of systems is negatve.  PAST MEDICAL HISTORY: Past Medical History  Diagnosis Date  . Hypertension     PAST SURGICAL HISTORY: Past Surgical History  Procedure Laterality Date  . Total hip arthroplasty Left     FAMILY HISTORY No family history on file.     ADVANCED DIRECTIVES:    HEALTH MAINTENANCE: Social History  Substance Use Topics  . Smoking status: Current Every Day Smoker -- 0.50 packs/day    Types: Cigarettes  . Smokeless tobacco: Never Used  . Alcohol Use: Yes     Comment: occasion     Colonoscopy:  PAP:  Bone density:  Lipid panel:  Allergies  Allergen Reactions  . Atenolol Other (See Comments)    Mood altering  . Metoprolol Other (See Comments)    Mood altering  . Tramadol Other (See Comments)    Not metabolized    Current Outpatient Prescriptions  Medication Sig Dispense  Refill  . ALPRAZolam (XANAX) 1 MG tablet Take 1 mg by mouth 2 (two) times daily as needed for anxiety.    . cyclobenzaprine (FLEXERIL) 10 MG tablet Take 10 mg by mouth 3 (three) times daily.    . folic acid (FOLVITE) 1 MG tablet Take 1 tablet (1 mg total) by mouth daily. 30 tablet 2   No current facility-administered medications for this visit.    OBJECTIVE: Filed Vitals:   03/16/15 1408  BP: 177/92  Pulse: 99  Temp: 96.2 F (35.7 C)  Resp: 16     Body mass index is 25.92 kg/(m^2).    ECOG FS:0 - Asymptomatic  General: Well-developed, well-nourished, no acute distress. Eyes: Pink conjunctiva, anicteric sclera. Lungs: Clear to auscultation bilaterally. Heart: Regular rate and rhythm. No rubs, murmurs, or gallops. Abdomen: Soft, nontender, nondistended. No organomegaly noted, normoactive bowel sounds. Musculoskeletal: No edema, cyanosis, or clubbing. Neuro: Alert, answering all questions appropriately. Cranial nerves grossly intact. Skin: No rashes or petechiae noted. Psych: Normal affect.    LAB RESULTS:  Lab Results  Component Value Date   NA 136 11/18/2014   K 4.2 11/18/2014   CL 95* 11/18/2014   CO2 28 11/18/2014   GLUCOSE 132* 11/18/2014   BUN 14 11/18/2014   CREATININE 1.09 11/18/2014   CALCIUM 9.8 11/18/2014   PROT 8.7* 11/18/2014   ALBUMIN 3.7 11/18/2014   AST 128* 11/18/2014   ALT 48 11/18/2014   ALKPHOS 218* 11/18/2014   BILITOT 1.4* 11/18/2014   GFRNONAA >60 11/18/2014   GFRAA >  60 11/18/2014    Lab Results  Component Value Date   WBC 8.0 03/16/2015   NEUTROABS 5.2 03/16/2015   HGB 11.4* 03/16/2015   HCT 35.3* 03/16/2015   MCV 83.7 03/16/2015   PLT 359 03/16/2015     STUDIES: No results found.  ASSESSMENT: Iron deficiency anemia.  PLAN:    1. Anemia: Patient's hemoglobin is only mildly decreased, but he was found to have decreased iron stores as well as decreased folate level. The remainder of his laboratory work was either negative or  within normal limits including B-12 and hemolysis labs. Proceed with 510 mg IV Feraheme today and return to clinic in 1 week for a second dose. Patient was also given a prescription for folate supplementation. Return to clinic in 3 months with repeat laboratory work and further evaluation. 2. Hyperbilirubinemia: Unclear etiology, no evidence of hemolysis. Monitor.  Patient expressed understanding and was in agreement with this plan. He also understands that He can call clinic at any time with any questions, concerns, or complaints.   Lloyd Huger, MD   03/21/2015 4:55 PM

## 2015-03-23 ENCOUNTER — Inpatient Hospital Stay: Payer: No Typology Code available for payment source

## 2015-03-23 VITALS — BP 155/86 | HR 98 | Temp 97.8°F | Resp 20

## 2015-03-23 DIAGNOSIS — D509 Iron deficiency anemia, unspecified: Secondary | ICD-10-CM | POA: Diagnosis not present

## 2015-03-23 MED ORDER — SODIUM CHLORIDE 0.9 % IV SOLN
510.0000 mg | Freq: Once | INTRAVENOUS | Status: AC
  Administered 2015-03-23: 510 mg via INTRAVENOUS
  Filled 2015-03-23: qty 17

## 2015-03-23 MED ORDER — SODIUM CHLORIDE 0.9 % IV SOLN
Freq: Once | INTRAVENOUS | Status: AC
  Administered 2015-03-23: 14:00:00 via INTRAVENOUS
  Filled 2015-03-23: qty 1000

## 2015-03-31 ENCOUNTER — Telehealth: Payer: Self-pay | Admitting: *Deleted

## 2015-03-31 NOTE — Telephone Encounter (Signed)
Patient called in today to report weakness and fainting spells, patient reports he received iron infusion 2 weeks ago. Dr. Grayland Ormond notified and states symptoms are not related as hgb is 11.4. Patient notified and verbalized understanding, states he has already placed call to Dr. Doy Hutching.

## 2015-06-15 ENCOUNTER — Inpatient Hospital Stay: Payer: No Typology Code available for payment source | Admitting: Oncology

## 2015-06-15 ENCOUNTER — Inpatient Hospital Stay: Payer: No Typology Code available for payment source | Attending: Oncology

## 2015-06-15 ENCOUNTER — Inpatient Hospital Stay: Payer: No Typology Code available for payment source | Admitting: Family Medicine

## 2016-05-27 NOTE — Progress Notes (Signed)
[place]  [phone] [phone]) [phone]  ID: Todd Beard OB: [birth year]  MR#: [id number]  [id number]  Patient Care Team: Idelle Crouch, MD as PCP - General (Internal Medicine)  CHIEF COMPLAINT: Iron deficiency anemia.  INTERVAL HISTORY: Patient last evaluated in clinic in September 2016. PE is referred back for further evaluation and consideration of reinitiation of IV Feraheme. He continues to feel well and is asymptomatic. He denies any weakness or fatigue. He has no neurologic complaints. He denies any recent fevers or illnesses. He denies any chest pain or shortness of breath. He denies any nausea, vomiting, constipation, or diarrhea. He has no melanoma or hematochezia. He has no urinary complaints. Patient offers no specific complaints today.  REVIEW OF SYSTEMS:   Review of Systems  Constitutional: Negative for fever, malaise/fatigue and weight loss.  Respiratory: Negative.  Negative for cough and shortness of breath.   Cardiovascular: Negative.  Negative for chest pain and leg swelling.  Gastrointestinal: Negative.  Negative for abdominal pain, blood in stool and melena.  Genitourinary: Negative.   Musculoskeletal: Negative.   Neurological: Negative.  Negative for weakness.  Psychiatric/Behavioral: Negative.  The patient is not nervous/anxious.     As per HPI. Otherwise, a complete review of systems is negative.  PAST MEDICAL HISTORY: Past Medical History:  Diagnosis Date  . Hypertension     PAST SURGICAL HISTORY: Past Surgical History:  Procedure Laterality Date  . TOTAL HIP ARTHROPLASTY Left     FAMILY HISTORY No family history on file.     ADVANCED DIRECTIVES:    HEALTH MAINTENANCE: Social History  Substance Use Topics  . Smoking status: Current Every Day Smoker    Packs/day: 0.50    Types: Cigarettes  . Smokeless tobacco: Never Used  . Alcohol use Yes     Comment: occasion     Colonoscopy:  PAP:  Bone  density:  Lipid panel:  Allergies  Allergen Reactions  . Atenolol Other (See Comments)    Mood altering  . Metoprolol Other (See Comments)    Mood altering  . Tramadol Other (See Comments)    Not metabolized    Current Outpatient Prescriptions  Medication Sig Dispense Refill  . ALPRAZolam (XANAX) 1 MG tablet Take 1 mg by mouth 2 (two) times daily as needed for anxiety.    Marland Kitchen amLODipine (NORVASC) 5 MG tablet TAKE ONE (1) TABLET BY MOUTH EVERY DAY    . atorvastatin (LIPITOR) 10 MG tablet   10  . carvedilol (COREG) 12.5 MG tablet   10  . folic acid (FOLVITE) 1 MG tablet Take 1 tablet (1 mg total) by mouth daily. 30 tablet 2  . magnesium oxide (MAG-OX) 400 MG tablet Take by mouth.    . potassium chloride (MICRO-K) 10 MEQ CR capsule   10  . cyclobenzaprine (FLEXERIL) 10 MG tablet Take 10 mg by mouth 3 (three) times daily.     No current facility-administered medications for this visit.     OBJECTIVE: Vitals:   05/29/16 1041  BP: (!) 161/82  Pulse: 93  Resp: 18  Temp: 98 F (36.7 C)     Body mass index is 31.38 kg/m.    ECOG FS:0 - Asymptomatic  General: Well-developed, well-nourished, no acute distress. Eyes: Pink conjunctiva, anicteric sclera. Lungs: Clear to auscultation bilaterally. Heart: Regular rate and rhythm. No rubs, murmurs, or gallops. Abdomen: Soft, nontender, nondistended. No organomegaly noted, normoactive bowel sounds. Musculoskeletal: No edema, cyanosis, or clubbing. Neuro: Alert, answering all questions  appropriately. Cranial nerves grossly intact. Skin: No rashes or petechiae noted. Psych: Normal affect.    LAB RESULTS:  Lab Results  Component Value Date   NA 136 11/18/2014   K 4.2 11/18/2014   CL 95 (L) 11/18/2014   CO2 28 11/18/2014   GLUCOSE 132 (H) 11/18/2014   BUN 14 11/18/2014   CREATININE 1.09 11/18/2014   CALCIUM 9.8 11/18/2014   PROT 8.7 (H) 11/18/2014   ALBUMIN 3.7 11/18/2014   AST 128 (H) 11/18/2014   ALT 48 11/18/2014   ALKPHOS  218 (H) 11/18/2014   BILITOT 1.4 (H) 11/18/2014   GFRNONAA >60 11/18/2014   GFRAA >60 11/18/2014    Lab Results  Component Value Date   WBC 8.1 05/29/2016   NEUTROABS 5.5 05/29/2016   HGB 13.6 05/29/2016   HCT 38.9 (L) 05/29/2016   MCV 104.2 (H) 05/29/2016   PLT 176 05/29/2016   Lab Results  Component Value Date   IRON 143 05/29/2016   TIBC 382 05/29/2016   IRONPCTSAT 38 05/29/2016   Lab Results  Component Value Date   FERRITIN 791 (H) 05/29/2016     STUDIES: No results found.  ASSESSMENT: Iron deficiency anemia.  PLAN:    1. Iron deficiency anemia: Patient's hemoglobin and iron stores continue to be within normal limits. I suspect his elevated ferritin level is secondary to acute phase reactant. No intervention is needed at this time. Patient last received Feraheme in September 2016. Return to clinic in 3 months with repeat laboratory work and further evaluation. If his blood work continues to remain within normal limits, he could possibly be discharged from clinic at that time.  2. Macrocytosis: B-12 and folate are within normal limits. Suspect this may be related to his underlying alcohol use.  Patient expressed understanding and was in agreement with this plan. He also understands that He can call clinic at any time with any questions, concerns, or complaints.   Lloyd Huger, MD   05/29/2016 1:44 PM

## 2016-05-29 ENCOUNTER — Inpatient Hospital Stay: Payer: BLUE CROSS/BLUE SHIELD | Attending: Oncology | Admitting: Oncology

## 2016-05-29 ENCOUNTER — Inpatient Hospital Stay: Payer: BLUE CROSS/BLUE SHIELD

## 2016-05-29 VITALS — BP 161/82 | HR 93 | Temp 98.0°F | Resp 18 | Wt 194.4 lb

## 2016-05-29 DIAGNOSIS — Z96642 Presence of left artificial hip joint: Secondary | ICD-10-CM | POA: Diagnosis not present

## 2016-05-29 DIAGNOSIS — D509 Iron deficiency anemia, unspecified: Secondary | ICD-10-CM | POA: Diagnosis not present

## 2016-05-29 DIAGNOSIS — Z79899 Other long term (current) drug therapy: Secondary | ICD-10-CM

## 2016-05-29 DIAGNOSIS — D508 Other iron deficiency anemias: Secondary | ICD-10-CM

## 2016-05-29 DIAGNOSIS — I10 Essential (primary) hypertension: Secondary | ICD-10-CM | POA: Diagnosis not present

## 2016-05-29 DIAGNOSIS — F17210 Nicotine dependence, cigarettes, uncomplicated: Secondary | ICD-10-CM | POA: Insufficient documentation

## 2016-05-29 LAB — CBC WITH DIFFERENTIAL/PLATELET
BASOS PCT: 0 %
Basophils Absolute: 0 10*3/uL (ref 0–0.1)
Eosinophils Absolute: 0.1 10*3/uL (ref 0–0.7)
Eosinophils Relative: 1 %
HEMATOCRIT: 38.9 % — AB (ref 40.0–52.0)
Hemoglobin: 13.6 g/dL (ref 13.0–18.0)
Lymphocytes Relative: 19 %
Lymphs Abs: 1.5 10*3/uL (ref 1.0–3.6)
MCH: 36.5 pg — AB (ref 26.0–34.0)
MCHC: 35 g/dL (ref 32.0–36.0)
MCV: 104.2 fL — AB (ref 80.0–100.0)
MONO ABS: 1 10*3/uL (ref 0.2–1.0)
MONOS PCT: 13 %
NEUTROS ABS: 5.5 10*3/uL (ref 1.4–6.5)
Neutrophils Relative %: 67 %
Platelets: 176 10*3/uL (ref 150–440)
RBC: 3.74 MIL/uL — ABNORMAL LOW (ref 4.40–5.90)
RDW: 17.5 % — AB (ref 11.5–14.5)
WBC: 8.1 10*3/uL (ref 3.8–10.6)

## 2016-05-29 LAB — FOLATE: FOLATE: 6.9 ng/mL (ref >5.9)

## 2016-05-29 LAB — IRON AND TIBC
IRON: 143 ug/dL (ref 45–182)
Saturation Ratios: 38 % (ref 17.9–39.5)
TIBC: 382 ug/dL (ref 250–450)
UIBC: 239 ug/dL

## 2016-05-29 LAB — VITAMIN B12: VITAMIN B 12: 327 pg/mL (ref 180–914)

## 2016-05-29 LAB — FERRITIN: Ferritin: 791 ng/mL — ABNORMAL HIGH (ref 24–336)

## 2016-05-29 NOTE — Progress Notes (Signed)
Patient states he is returning to see Dr. Grayland Ormond at the request of PCP, Dr. Doy Hutching, after his annual physical a couple of weeks ago.

## 2016-06-23 IMAGING — CT CT ABD-PELV W/ CM
1 of 3 series · 13 of 32 positions shown, 18 images · IV contrast (omnipaque)
Comparison: None.

CLINICAL DATA: Fell in the shower this morning.  Right rib pain.

EXAM:
CT CHEST, ABDOMEN, AND PELVIS WITH CONTRAST
TECHNIQUE: Multidetector CT imaging of the chest, abdomen and pelvis was
performed following the standard protocol during bolus
administration of intravenous contrast.
CONTRAST:  100mL OMNIPAQUE IOHEXOL 350 MG/ML SOLN

[Series 2: cap with · axial · 0.70mm/px · z∈[+316,+870]mm · 13 of 125 slices shown, 18 images]
[im 7/125  soft-tissue]
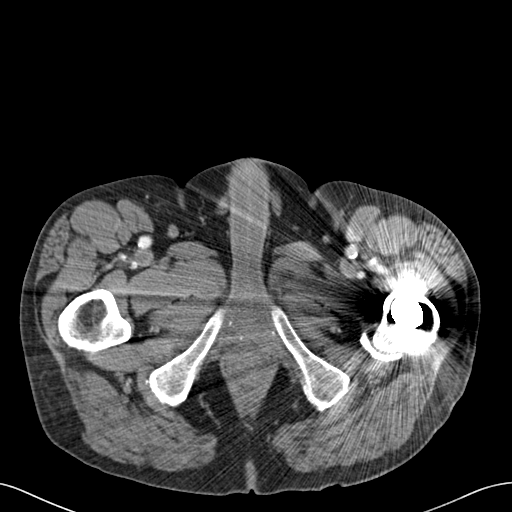
[im 7/125  bone]
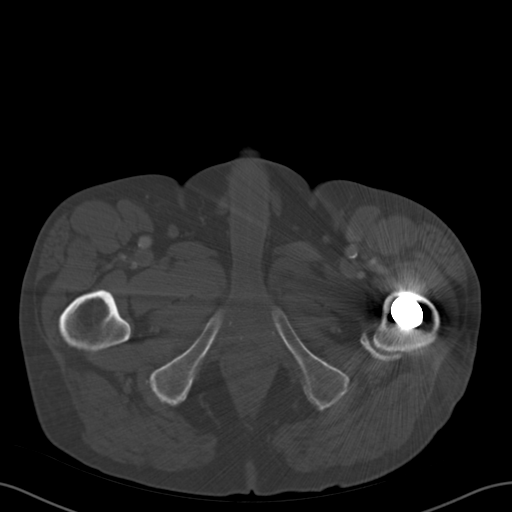
[im 21/125  soft-tissue]
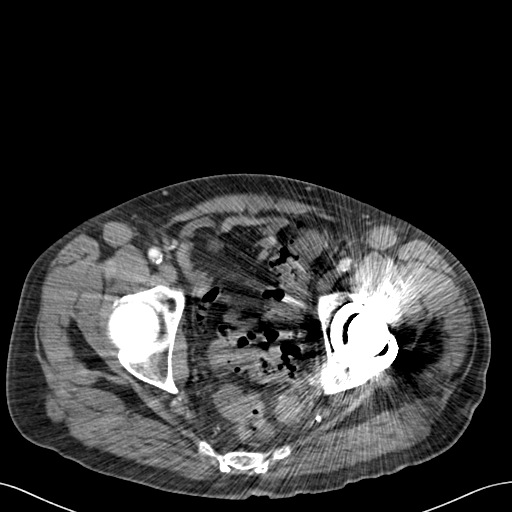
[im 28/125  soft-tissue]
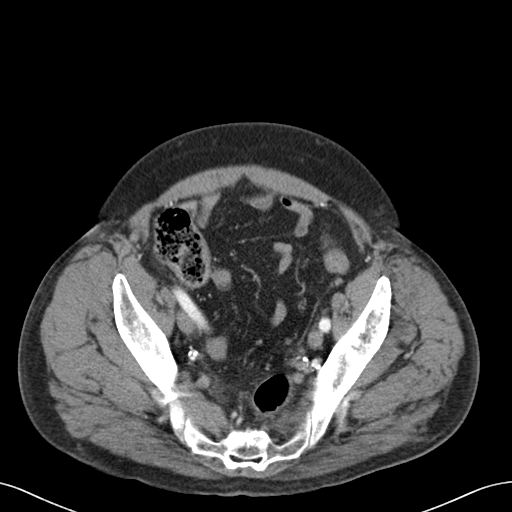
[im 35/125  soft-tissue]
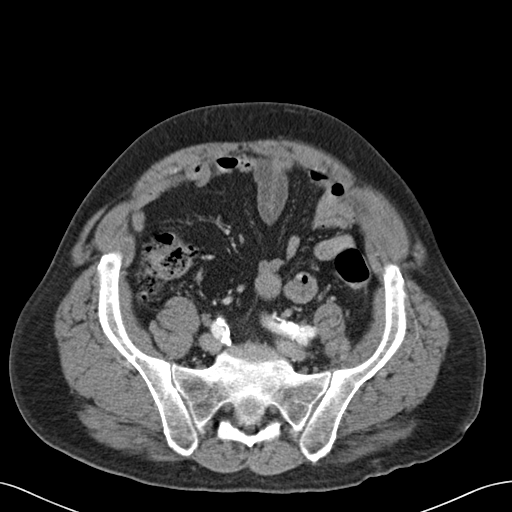
[im 49/125  soft-tissue]
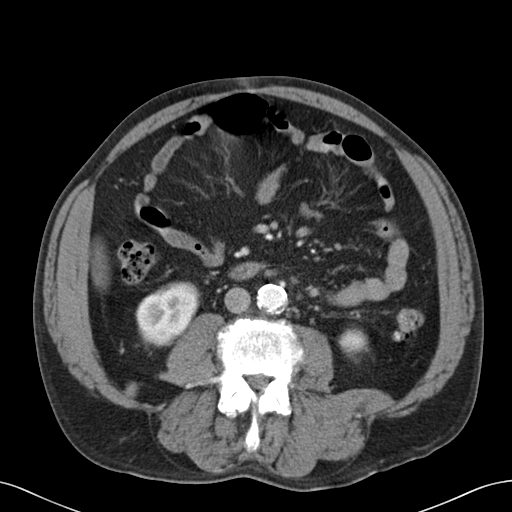
[im 56/125  soft-tissue]
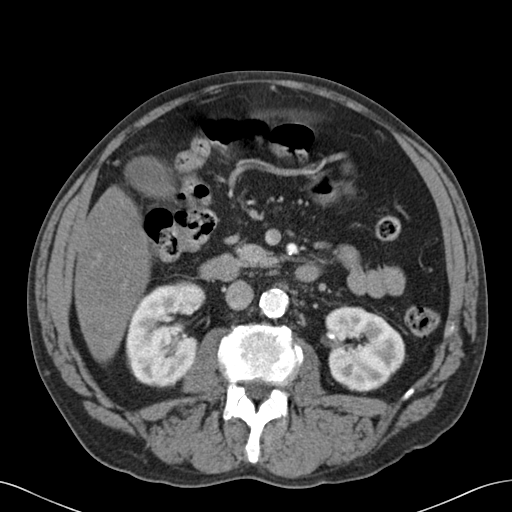
[im 69/125  soft-tissue]
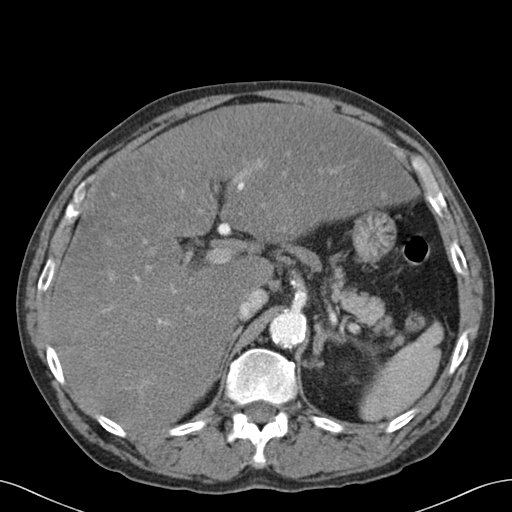
[im 76/125  soft-tissue]
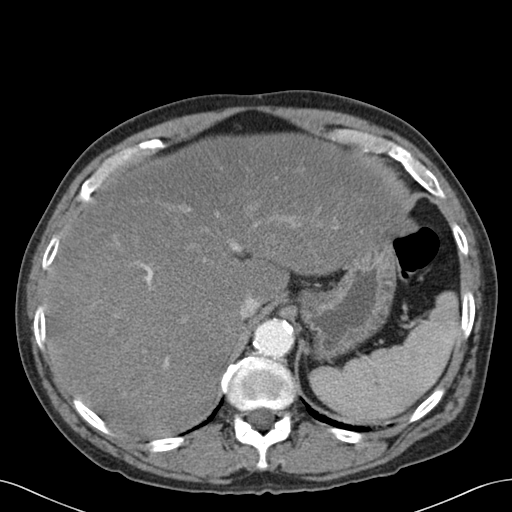
[im 90/125  soft-tissue]
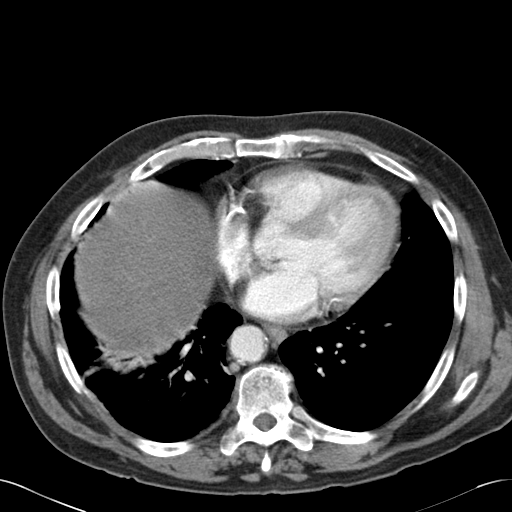
[im 90/125  bone]
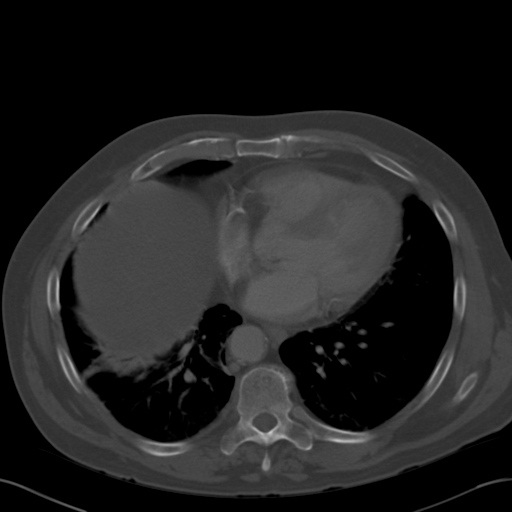
[im 97/125  soft-tissue]
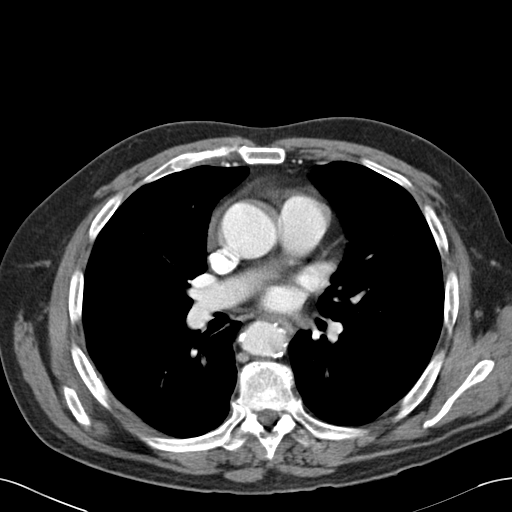
[im 97/125  lung]
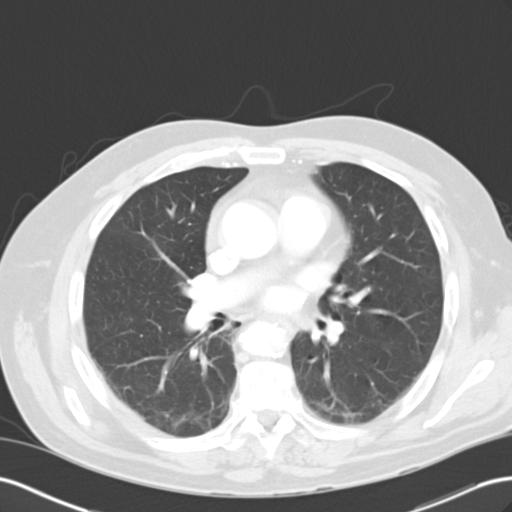
[im 104/125  soft-tissue]
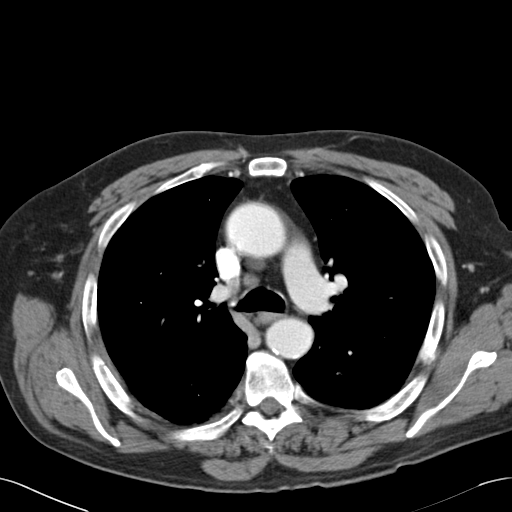
[im 104/125  lung]
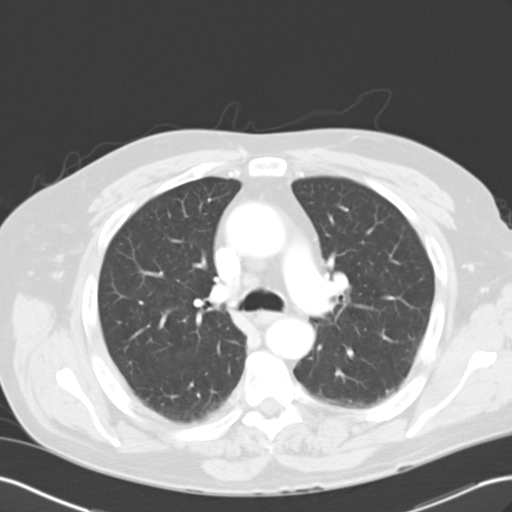
[im 111/125  lung]
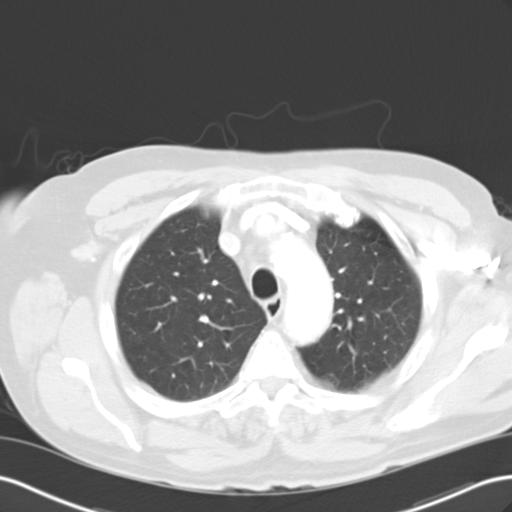
[im 118/125  soft-tissue]
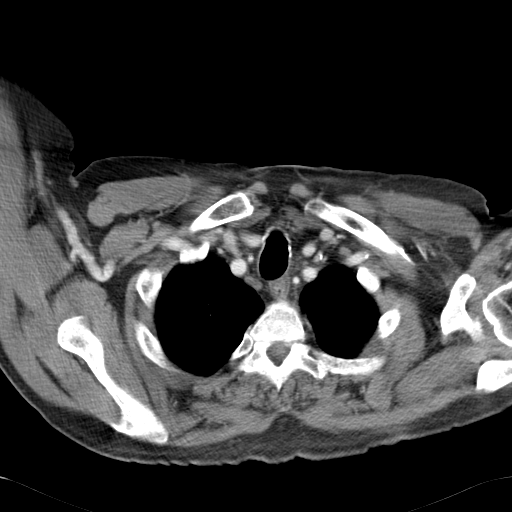
[im 118/125  lung]
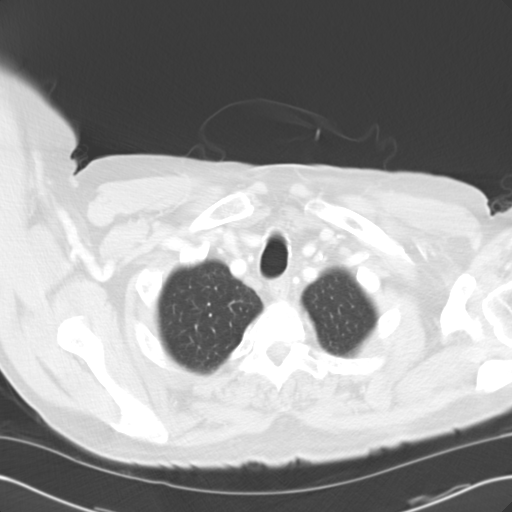

[13 of 32 positions shown; findings below may reference images not displayed]

FINDINGS: CT CHEST FINDINGS

There are acute minimally displaced fractures of the right sixth and
seventh and eighth ribs. There is old fracture deformity of the
right ninth and tenth ribs. There is old fracture deformity of the
left third rib there are mild linear atelectatic appearing opacities
in the right lung base. There is no pneumothorax. There is no
effusion. Mediastinum and great vessels are intact. Thoracic aorta
is normal in caliber and intact.

CT ABDOMEN AND PELVIS FINDINGS

There is diffuse fatty infiltration of the liver without focal
lesion. The liver is moderately enlarged. The spleen is normal in
size and unremarkable. Gallbladder and bile ducts are unremarkable.

There are normal appearances of the pancreas. There is a 11 x 16 mm
left adrenal nodule with benign appearances. The kidneys, ureters
and bladder appear unremarkable.

The abdominal aorta is normal in caliber with moderate
atherosclerotic calcification.

Stomach and small bowel appear normal. There is extensive colonic
diverticulosis.

There is no peritoneal blood or free air.

There is mild metal artifact from a left total hip arthroplasty.
Skeletal structures appear intact.
IMPRESSION: 1. Acute fractures of the right sixth, seventh and eighth ribs.
2. Old chronic fracture deformities of the right ninth and tenth
ribs, as well as the left third rib.
3. Mild linear atelectatic appearing right lung base opacities
4. Enlarged fatty liver
5. 11 x 16 mm left adrenal nodule with benign appearances. Consider
a follow-up scan in 12 months to assess stability. This
recommendation follows ACR consensus guidelines: Managing Incidental
Findings on Abdominal CT: White Paper of the ACR Incidental Findings
Committee. [HOSPITAL] [id number];[DATE]

## 2016-08-29 ENCOUNTER — Inpatient Hospital Stay: Payer: BLUE CROSS/BLUE SHIELD | Attending: Oncology

## 2016-09-03 ENCOUNTER — Ambulatory Visit: Payer: BLUE CROSS/BLUE SHIELD | Admitting: Oncology

## 2016-09-03 ENCOUNTER — Ambulatory Visit: Payer: BLUE CROSS/BLUE SHIELD

## 2016-09-05 ENCOUNTER — Other Ambulatory Visit: Payer: Self-pay | Admitting: Oncology

## 2016-09-05 NOTE — Progress Notes (Deleted)
[place]  [phone] Fax:(336) [phone]  ID: Todd Beard OB: July 23, 1950  MR#: [id number]  [id number]  Patient Care Team: Idelle Crouch, MD as PCP - General (Internal Medicine)  CHIEF COMPLAINT: Iron deficiency anemia.  INTERVAL HISTORY: Patient last evaluated in clinic in September 2016. PE is referred back for further evaluation and consideration of reinitiation of IV Feraheme. He continues to feel well and is asymptomatic. He denies any weakness or fatigue. He has no neurologic complaints. He denies any recent fevers or illnesses. He denies any chest pain or shortness of breath. He denies any nausea, vomiting, constipation, or diarrhea. He has no melanoma or hematochezia. He has no urinary complaints. Patient offers no specific complaints today.  REVIEW OF SYSTEMS:   Review of Systems  Constitutional: Negative for fever, malaise/fatigue and weight loss.  Respiratory: Negative.  Negative for cough and shortness of breath.   Cardiovascular: Negative.  Negative for chest pain and leg swelling.  Gastrointestinal: Negative.  Negative for abdominal pain, blood in stool and melena.  Genitourinary: Negative.   Musculoskeletal: Negative.   Neurological: Negative.  Negative for weakness.  Psychiatric/Behavioral: Negative.  The patient is not nervous/anxious.     As per HPI. Otherwise, a complete review of systems is negative.  PAST MEDICAL HISTORY: Past Medical History:  Diagnosis Date  . Hypertension     PAST SURGICAL HISTORY: Past Surgical History:  Procedure Laterality Date  . TOTAL HIP ARTHROPLASTY Left     FAMILY HISTORY No family history on file.     ADVANCED DIRECTIVES:    HEALTH MAINTENANCE: Social History  Substance Use Topics  . Smoking status: Current Every Day Smoker    Packs/day: 0.50    Types: Cigarettes  . Smokeless tobacco: Never Used  . Alcohol use Yes     Comment: occasion     Colonoscopy:  PAP:  Bone  density:  Lipid panel:  Allergies  Allergen Reactions  . Atenolol Other (See Comments)    Mood altering  . Metoprolol Other (See Comments)    Mood altering  . Tramadol Other (See Comments)    Not metabolized    Current Outpatient Prescriptions  Medication Sig Dispense Refill  . ALPRAZolam (XANAX) 1 MG tablet Take 1 mg by mouth 2 (two) times daily as needed for anxiety.    Marland Kitchen amLODipine (NORVASC) 5 MG tablet TAKE ONE (1) TABLET BY MOUTH EVERY DAY    . atorvastatin (LIPITOR) 10 MG tablet   10  . carvedilol (COREG) 12.5 MG tablet   10  . cyclobenzaprine (FLEXERIL) 10 MG tablet Take 10 mg by mouth 3 (three) times daily.    . folic acid (FOLVITE) 1 MG tablet Take 1 tablet (1 mg total) by mouth daily. 30 tablet 2  . magnesium oxide (MAG-OX) 400 MG tablet Take by mouth.    . potassium chloride (MICRO-K) 10 MEQ CR capsule   10   No current facility-administered medications for this visit.     OBJECTIVE: There were no vitals filed for this visit.   There is no height or weight on file to calculate BMI.    ECOG FS:0 - Asymptomatic  General: Well-developed, well-nourished, no acute distress. Eyes: Pink conjunctiva, anicteric sclera. Lungs: Clear to auscultation bilaterally. Heart: Regular rate and rhythm. No rubs, murmurs, or gallops. Abdomen: Soft, nontender, nondistended. No organomegaly noted, normoactive bowel sounds. Musculoskeletal: No edema, cyanosis, or clubbing. Neuro: Alert, answering all questions appropriately. Cranial nerves grossly intact. Skin: No rashes or petechiae  noted. Psych: Normal affect.    LAB RESULTS:  Lab Results  Component Value Date   NA 136 11/18/2014   K 4.2 11/18/2014   CL 95 (L) 11/18/2014   CO2 28 11/18/2014   GLUCOSE 132 (H) 11/18/2014   BUN 14 11/18/2014   CREATININE 1.09 11/18/2014   CALCIUM 9.8 11/18/2014   PROT 8.7 (H) 11/18/2014   ALBUMIN 3.7 11/18/2014   AST 128 (H) 11/18/2014   ALT 48 11/18/2014   ALKPHOS 218 (H) 11/18/2014    BILITOT 1.4 (H) 11/18/2014   GFRNONAA >60 11/18/2014   GFRAA >60 11/18/2014    Lab Results  Component Value Date   WBC 8.1 05/29/2016   NEUTROABS 5.5 05/29/2016   HGB 13.6 05/29/2016   HCT 38.9 (L) 05/29/2016   MCV 104.2 (H) 05/29/2016   PLT 176 05/29/2016   Lab Results  Component Value Date   IRON 143 05/29/2016   TIBC 382 05/29/2016   IRONPCTSAT 38 05/29/2016   Lab Results  Component Value Date   FERRITIN 791 (H) 05/29/2016     STUDIES: No results found.  ASSESSMENT: Iron deficiency anemia.  PLAN:    1. Iron deficiency anemia: Patient's hemoglobin and iron stores continue to be within normal limits. I suspect his elevated ferritin level is secondary to acute phase reactant. No intervention is needed at this time. Patient last received Feraheme in September 2016. Return to clinic in 3 months with repeat laboratory work and further evaluation. If his blood work continues to remain within normal limits, he could possibly be discharged from clinic at that time.  2. Macrocytosis: B-12 and folate are within normal limits. Suspect this may be related to his underlying alcohol use.  Patient expressed understanding and was in agreement with this plan. He also understands that He can call clinic at any time with any questions, concerns, or complaints.   Lloyd Huger, MD   09/05/2016 11:08 PM

## 2016-09-06 ENCOUNTER — Inpatient Hospital Stay: Payer: BLUE CROSS/BLUE SHIELD

## 2016-09-06 ENCOUNTER — Inpatient Hospital Stay: Payer: BLUE CROSS/BLUE SHIELD | Admitting: Oncology

## 2017-04-02 ENCOUNTER — Other Ambulatory Visit (INDEPENDENT_AMBULATORY_CARE_PROVIDER_SITE_OTHER): Payer: Self-pay | Admitting: Vascular Surgery

## 2017-04-02 DIAGNOSIS — I779 Disorder of arteries and arterioles, unspecified: Secondary | ICD-10-CM

## 2017-04-02 DIAGNOSIS — I739 Peripheral vascular disease, unspecified: Principal | ICD-10-CM

## 2017-04-04 ENCOUNTER — Ambulatory Visit (INDEPENDENT_AMBULATORY_CARE_PROVIDER_SITE_OTHER): Payer: BLUE CROSS/BLUE SHIELD | Admitting: Vascular Surgery

## 2017-04-04 ENCOUNTER — Encounter (INDEPENDENT_AMBULATORY_CARE_PROVIDER_SITE_OTHER): Payer: BLUE CROSS/BLUE SHIELD

## 2017-07-03 ENCOUNTER — Emergency Department
Admission: EM | Admit: 2017-07-03 | Discharge: 2017-07-04 | Disposition: A | Discharge status: Home / Self Care | Payer: BLUE CROSS/BLUE SHIELD | Attending: Emergency Medicine | Admitting: Emergency Medicine

## 2017-07-03 ENCOUNTER — Other Ambulatory Visit: Payer: Self-pay

## 2017-07-03 DIAGNOSIS — Y9389 Activity, other specified: Secondary | ICD-10-CM | POA: Insufficient documentation

## 2017-07-03 DIAGNOSIS — Y92049 Unspecified place in boarding-house as the place of occurrence of the external cause: Secondary | ICD-10-CM | POA: Diagnosis not present

## 2017-07-03 DIAGNOSIS — Z79899 Other long term (current) drug therapy: Secondary | ICD-10-CM | POA: Insufficient documentation

## 2017-07-03 DIAGNOSIS — F17210 Nicotine dependence, cigarettes, uncomplicated: Secondary | ICD-10-CM | POA: Insufficient documentation

## 2017-07-03 DIAGNOSIS — I10 Essential (primary) hypertension: Secondary | ICD-10-CM | POA: Insufficient documentation

## 2017-07-03 DIAGNOSIS — W19XXXA Unspecified fall, initial encounter: Secondary | ICD-10-CM | POA: Insufficient documentation

## 2017-07-03 DIAGNOSIS — F10929 Alcohol use, unspecified with intoxication, unspecified: Secondary | ICD-10-CM | POA: Diagnosis present

## 2017-07-03 LAB — COMPREHENSIVE METABOLIC PANEL
ALBUMIN: 4 g/dL (ref 3.5–5.0)
ALK PHOS: 232 U/L — AB (ref 38–126)
ALT: 19 U/L (ref 17–63)
AST: 40 U/L (ref 15–41)
Anion gap: 11 (ref 5–15)
BILIRUBIN TOTAL: 1.4 mg/dL — AB (ref 0.3–1.2)
BUN: 16 mg/dL (ref 6–20)
CO2: 22 mmol/L (ref 22–32)
Calcium: 8.7 mg/dL — ABNORMAL LOW (ref 8.9–10.3)
Chloride: 99 mmol/L — ABNORMAL LOW (ref 101–111)
Creatinine, Ser: 1.27 mg/dL — ABNORMAL HIGH (ref 0.61–1.24)
GFR calc Af Amer: >60 mL/min (ref >60)
GFR calc non Af Amer: 58 mL/min — ABNORMAL LOW (ref >60)
GLUCOSE: 97 mg/dL (ref 65–99)
POTASSIUM: 3.6 mmol/L (ref 3.5–5.1)
Sodium: 132 mmol/L — ABNORMAL LOW (ref 135–145)
TOTAL PROTEIN: 8.1 g/dL (ref 6.5–8.1)

## 2017-07-03 LAB — CBC
HEMATOCRIT: 44.5 % (ref 40.0–52.0)
HEMOGLOBIN: 14.7 g/dL (ref 13.0–18.0)
MCH: 30.5 pg (ref 26.0–34.0)
MCHC: 33 g/dL (ref 32.0–36.0)
MCV: 92.4 fL (ref 80.0–100.0)
Platelets: 216 10*3/uL (ref 150–440)
RBC: 4.82 MIL/uL (ref 4.40–5.90)
RDW: 16.7 % — ABNORMAL HIGH (ref 11.5–14.5)
WBC: 5.8 10*3/uL (ref 3.8–10.6)

## 2017-07-03 LAB — ETHANOL: Alcohol, Ethyl (B): 279 mg/dL — ABNORMAL HIGH (ref <10)

## 2017-07-03 MED ORDER — SODIUM CHLORIDE 0.9 % IV BOLUS (SEPSIS)
1000.0000 mL | Freq: Once | INTRAVENOUS | Status: AC
  Administered 2017-07-03: 1000 mL via INTRAVENOUS

## 2017-07-03 MED ORDER — LORAZEPAM 1 MG PO TABS
1.0000 mg | ORAL_TABLET | Freq: Once | ORAL | Status: AC
  Administered 2017-07-03: 1 mg via ORAL

## 2017-07-03 MED ORDER — LORAZEPAM 1 MG PO TABS
ORAL_TABLET | ORAL | Status: AC
  Filled 2017-07-03: qty 1

## 2017-07-03 NOTE — ED Provider Notes (Signed)
[facility] Emergency Department Provider Note    First MD Initiated Contact with Patient 07/03/17 1936     (approximate)  I have reviewed the triage vital signs and the nursing notes.   HISTORY  Chief Complaint Fall   HPI Todd Beard is a 66 y.o. male since to the emergency department via EMS with history of heavy EtOH ingestion with [place] today.  Per EMS on their arrival patient refused transport to the hospital stating that he had no pain or any complaints.  EMS personnel stated that they placed the patient in bed and was in route to leave when they were notified by boarding house staff that the patient had another fall.  Patient denies any injury denies any pain at present.  Patient does admit to drinking approximately 3 pints of alcohol today.   Past Medical History:  Diagnosis Date  . Hypertension     Patient Active Problem List   Diagnosis Date Noted  . Iron deficiency anemia 03/16/2015    Past Surgical History:  Procedure Laterality Date  . TOTAL HIP ARTHROPLASTY Left     Prior to Admission medications   Medication Sig Start Date End Date Taking? Authorizing Provider  ALPRAZolam Duanne Moron) 1 MG tablet Take 1 mg by mouth 2 (two) times daily as needed for anxiety.    [provider]  amLODipine (NORVASC) 5 MG tablet TAKE ONE (1) TABLET BY MOUTH EVERY DAY 04/25/16   [provider]  atorvastatin (LIPITOR) 10 MG tablet  05/03/16   [provider]  carvedilol (COREG) 12.5 MG tablet  04/25/16   [provider]  cyclobenzaprine (FLEXERIL) 10 MG tablet Take 10 mg by mouth 3 (three) times daily.    [provider]  folic acid (FOLVITE) 1 MG tablet Take 1 tablet (1 mg total) by mouth daily. 03/16/15   Lloyd Huger, MD  potassium chloride (MICRO-K) 10 MEQ CR capsule  05/03/16   [provider]    Allergies Atenolol; Metoprolol; and Tramadol  No family history on file.  Social  History Social History   Tobacco Use  . Smoking status: Current Every Day Smoker    Packs/day: 0.50    Types: Cigarettes  . Smokeless tobacco: Never Used  Substance Use Topics  . Alcohol use: Yes    Comment: daily  . Drug use: No    Review of Systems Constitutional: No fever/chills Eyes: No visual changes. ENT: No sore throat. Cardiovascular: Denies chest pain. Respiratory: Denies shortness of breath. Gastrointestinal: No abdominal pain.  No nausea, no vomiting.  No diarrhea.  No constipation. Genitourinary: Negative for dysuria. Musculoskeletal: Negative for neck pain.  Negative for back pain. Integumentary: Negative for rash. Neurological: Negative for headaches, focal weakness or numbness. Psychiatric:Positive for EtOH ingestion.   ____________________________________________   PHYSICAL EXAM:  VITAL SIGNS: ED Triage Vitals  Enc Vitals Group     BP 07/03/17 1944 133/84     Pulse Rate 07/03/17 1944 72     Resp 07/03/17 1944 14     Temp 07/03/17 1944 97.6 F (36.4 C)     Temp Source 07/03/17 1944 Oral     SpO2 07/03/17 1944 92 %     Weight 07/03/17 1945 79.4 kg (175 lb)     Height 07/03/17 1945 1.689 m (5' 6.5")     Head Circumference --      Peak Flow --      Pain Score --      Pain  Loc --      Pain Edu? --      Excl. in [place]? --     Constitutional: Alert and oriented. Appears intoxicated Eyes: Conjunctivae are normal. PERRL. EOMI. Head: Atraumatic. Ears:  Healthy appearing ear canals and TMs bilaterally Nose: No congestion/rhinnorhea. Mouth/Throat: Mucous membranes are moist.  Oropharynx non-erythematous. Neck: No stridor.   Cardiovascular: Normal rate, regular rhythm. Good peripheral circulation. Grossly normal heart sounds. Respiratory: Normal respiratory effort.  No retractions. Lungs CTAB. Gastrointestinal: Soft and nontender. No distention.  Musculoskeletal: No lower extremity tenderness nor edema. No gross deformities of extremities. Neurologic:  Slurred speech.  No gross focal neurologic deficits are appreciated.  Skin:  Skin is warm, dry and intact. No rash noted. Psychiatric: Mood and affect are normal.  Appears intoxicated.  ____________________________________________   LABS (all labs ordered are listed, but only abnormal results are displayed)  Labs Reviewed  ETHANOL  CBC  COMPREHENSIVE METABOLIC PANEL   __    Procedures   ____________________________________________   INITIAL IMPRESSION / ASSESSMENT AND PLAN / ED COURSE  As part of my medical decision making, I reviewed the following data within the electronic MEDICAL RECORD [id number] year old male presented with above-stated history and physical exam consistent with acute alcohol intoxication. ____________________________________________  FINAL CLINICAL IMPRESSION(S) / ED DIAGNOSES  Final diagnoses:  Alcoholic intoxication with complication ([place])     MEDICATIONS GIVEN DURING THIS VISIT:  Medications  sodium chloride 0.9 % bolus 1,000 mL (not administered)     ED Discharge Orders    None       Note:  This document was prepared using Dragon voice recognition software and may include unintentional dictation errors.    Gregor Hams, MD 07/04/17 0500

## 2017-07-03 NOTE — ED Notes (Signed)
Pt to the er from a boarding house for a fall. Pt did not want to come to the ER, no complaints. Paramedics put him in bed and then prior to them leaving, people at the boarding house asked them to come back and pt had fallen again. Pt drank 3 bottles of some type of orange alcohol from the [employer]. Pt says he normally drinks Johnny Walker black. Pt is calm and cooperative but words are slurred and judgement is poor due to ETOH. Explained to pt that we will keep him here until he sobers up.

## 2017-07-03 NOTE — ED Notes (Signed)
While drinking water, pt began coughing and choking. Pt able to clear his throat.

## 2017-07-03 NOTE — ED Triage Notes (Signed)
p-t to the er for multiple falls r/t etoh. No injuries. Lives in a boarding house.

## 2017-07-03 NOTE — ED Notes (Signed)
Food tray provided to pt;

## 2017-07-03 NOTE — ED Notes (Signed)
Pt keeps calling to try and find a ride so he can be discharged. Explained to pt that he is intoxicated and he is unable to be discharged in this condition. Pt

## 2017-07-03 NOTE — ED Notes (Signed)
Pt given a meal tray and soda. Pt also assisted to ambulate to the restroom and back to the bed in pt room. Pt displays an unbalanced, and unsteady gait.

## 2017-07-03 NOTE — ED Notes (Signed)
Pt moved to room 23 to sleep off.

## 2017-07-04 NOTE — ED Notes (Signed)
Pt ambulated un-assisted to the bathroom from his room and back. Pt displays an even, steady, and balanced gait.

## 2017-07-04 NOTE — ED Notes (Signed)
Patient observed lying in bed with eyes closed  Even, unlabored respirations observed   NAD pt appears to be sleeping  I will continue to monitor along with every 15 minute visual observations and ongoing security monitoring    

## 2017-07-04 NOTE — ED Notes (Signed)
He has come to the doorway to report that someone will be here to pick him up - he denies pain at this time although he verbalizes "my hip is not right - it never is"  Assessment completed  Pt to discharge home

## 2017-07-04 NOTE — ED Notes (Signed)
Remote provided  BEHAVIORAL HEALTH ROUNDING Patient sleeping: No. Patient alert and oriented: yes Behavior appropriate: Yes.  ; If no, describe:  Nutrition and fluids offered: yes Toileting and hygiene offered: Yes  Sitter present: q15 minute observations and security monitoring Law enforcement present: Yes  ODS  ENVIRONMENTAL ASSESSMENT Potentially harmful objects out of patient reach: Yes.   Personal belongings secured: Yes.   Patient dressed in hospital provided attire only: Yes.   Plastic bags out of patient reach: Yes.   Patient care equipment (cords, cables, call bells, lines, and drains) shortened, removed, or accounted for: Yes.   Equipment and supplies removed from bottom of stretcher: Yes.   Potentially toxic materials out of patient reach: Yes.   Sharps container removed or out of patient reach: Yes.

## 2017-07-04 NOTE — ED Notes (Signed)
Awake - ambulatory with a steady gait to the BR   NAD assessed  - introduced myself to him  He denies pain - plan of care discussed including his pending discharge - pt reports that no one is awake yet to pick him up  Breakfast to be ordered

## 2017-07-26 ENCOUNTER — Other Ambulatory Visit: Payer: Self-pay | Admitting: Family Medicine

## 2017-07-26 DIAGNOSIS — M5416 Radiculopathy, lumbar region: Secondary | ICD-10-CM

## 2017-08-05 ENCOUNTER — Ambulatory Visit
Admission: RE | Admit: 2017-08-05 | Discharge: 2017-08-05 | Disposition: A | Discharge status: Home / Self Care | Payer: Medicare Other | Source: Ambulatory Visit | Attending: Family Medicine | Admitting: Family Medicine

## 2017-08-05 DIAGNOSIS — M5136 Other intervertebral disc degeneration, lumbar region: Secondary | ICD-10-CM | POA: Insufficient documentation

## 2017-08-05 DIAGNOSIS — M5416 Radiculopathy, lumbar region: Secondary | ICD-10-CM | POA: Diagnosis present

## 2017-08-05 DIAGNOSIS — M8938 Hypertrophy of bone, other site: Secondary | ICD-10-CM | POA: Diagnosis not present

## 2017-08-05 DIAGNOSIS — M4316 Spondylolisthesis, lumbar region: Secondary | ICD-10-CM | POA: Insufficient documentation

## 2017-08-05 DIAGNOSIS — M48061 Spinal stenosis, lumbar region without neurogenic claudication: Secondary | ICD-10-CM | POA: Diagnosis not present

## 2017-08-05 DIAGNOSIS — M5126 Other intervertebral disc displacement, lumbar region: Secondary | ICD-10-CM | POA: Diagnosis not present

## 2017-12-04 ENCOUNTER — Encounter: Payer: Self-pay | Admitting: Anesthesiology

## 2017-12-05 NOTE — Discharge Instructions (Signed)
INSTRUCTIONS FOLLOWING OCULOPLASTIC SURGERY °AMY M. FOWLER, MD ° °AFTER YOUR EYE SURGERY, THER ARE MANY THINGS THWIHC YOU, THE PATIENT, CAN DO TO ASSURE THE BEST POSSIBLE RESULT FROM YOUR OPERATION.  THIS SHEET SHOULD BE REFERRED TO WHENEVER QUESTIONS ARISE.  IF THERE ARE ANY QUESTIONS NOT ANSWERED HERE, DO NOT HESITATE TO CALL OUR OFFICE AT [phone] OR [phone].  THERE IS ALWAYS [facility] AVAILABLE TO CALL IF QUESTIONS OR PROBLEMS ARISE. ° °VISION: Your vision may be blurred and out of focus after surgery until you are able to stop using your ointment, swelling resolves and your eye(s) heal. This may take 1 to 2 weeks at the least.  If your vision becomes gradually more dim or dark, this is not normal and you need to call our office immediately. ° °EYE CARE: For the first 48 hours after surgery, use ice packs frequently - “20 minutes on, 20 minutes off” - to help reduce swelling and bruising.  Small bags of frozen peas or corn make good ice packs along with cloths soaked in ice water.  If you are wearing a patch or other type of dressing following surgery, keep this on for the amount of time specified by your doctor.  For the first week following surgery, you will need to treat your stitches with great care.  If is OK to shower, but take care to not allow soapy water to run into your eye(s) to help reduce changes of infection.  You may gently clean the eyelashes and around the eye(s) with cotton balls and sterile water, BUT DO NOT RUB THE STITCHES VIGOROUSLY.  Keeping your stitches moist with ointment will help promote healing with minimal scar formation. ° °ACTIVITY: When you leave the surgery center, you should go home, rest and be inactive.  The eye(s) may feel scratchy and keeping the eyes closed will allow for faster healing.  The first week following surgery, avoid straining (anything making the face turn red) or lifting over 20 pounds.  Additionally, avoid bending which causes your head to go below  your waist.  Using your eyes will NOT harm them, so feel free to read, watch television, use the computer, etc as desired.  Driving depends on each individual, so check with your doctor if you have questions about driving. ° °MEDICATIONS:  You will be given a prescription for an ointment to use 4 times a day on your stitches.  You can use the ointment in your eyes if they feel scratchy or irritated.  If you eyelid(s) don’t close completely when you sleep, put some ointment in your eyes before bedtime. ° °EMERGENCY: If you experience SEVERE EYE PAIN OR HEADACHE UNRELIEVED BY TYLENOL OR PERCOCET, NAUSEA OR VOMITING, WORSENING REDNESS, OR WORSENING VISION (ESPECIALLY VISION THAT WA INITIALLY BETTER) CALL [phone] OR [phone] DURING BUSINESS HOURS OR AFTER HOURS. ° °General Anesthesia, Adult, Care After °These instructions provide you with information about caring for yourself after your procedure. Your health care provider may also give you more specific instructions. Your treatment has been planned according to current medical practices, but problems sometimes occur. Call your health care provider if you have any problems or questions after your procedure. °What can I expect after the procedure? °After the procedure, it is common to have: °· Vomiting. °· A sore throat. °· Mental slowness. ° °It is common to feel: °· Nauseous. °· Cold or shivery. °· Sleepy. °· Tired. °· Sore or achy, even in parts of your body where you did not have surgery. ° °  Follow these instructions at home: °For at least 24 hours after the procedure: °· Do not: °? Participate in activities where you could fall or become injured. °? Drive. °? Use heavy machinery. °? Drink alcohol. °? Take sleeping pills or medicines that cause drowsiness. °? Make important decisions or sign legal documents. °? Take care of children on your own. °· Rest. °Eating and drinking °· If you vomit, drink water, juice, or soup when you can drink without  vomiting. °· Drink enough fluid to keep your urine clear or pale yellow. °· Make sure you have little or no nausea before eating solid foods. °· Follow the diet recommended by your health care provider. °General instructions °· Have a responsible adult stay with you until you are awake and alert. °· Return to your normal activities as told by your health care provider. Ask your health care provider what activities are safe for you. °· Take over-the-counter and prescription medicines only as told by your health care provider. °· If you smoke, do not smoke without supervision. °· Keep all follow-up visits as told by your health care provider. This is important. °Contact a health care provider if: °· You continue to have nausea or vomiting at home, and medicines are not helpful. °· You cannot drink fluids or start eating again. °· You cannot urinate after 8-12 hours. °· You develop a skin rash. °· You have fever. °· You have increasing redness at the site of your procedure. °Get help right away if: °· You have difficulty breathing. °· You have chest pain. °· You have unexpected bleeding. °· You feel that you are having a life-threatening or urgent problem. °This information is not intended to replace advice given to you by your health care provider. Make sure you discuss any questions you have with your health care provider. °Document Released: 10/01/2000 Document Revised: 11/28/2015 Document Reviewed: 06/09/2015 °Elsevier Interactive Patient Education © 2018 [employer] ° °

## 2017-12-10 ENCOUNTER — Ambulatory Visit
Admission: RE | Admit: 2017-12-10 | Discharge: 2017-12-10 | Disposition: A | Discharge status: Home / Self Care | Payer: 59 | Source: Ambulatory Visit | Attending: Ophthalmology | Admitting: Ophthalmology

## 2017-12-10 ENCOUNTER — Encounter
Admission: RE | Disposition: A | Discharge status: Home / Self Care | Payer: Self-pay | Source: Ambulatory Visit | Attending: Ophthalmology

## 2017-12-10 ENCOUNTER — Ambulatory Visit: Payer: 59 | Admitting: Anesthesiology

## 2017-12-10 DIAGNOSIS — Z79899 Other long term (current) drug therapy: Secondary | ICD-10-CM | POA: Insufficient documentation

## 2017-12-10 DIAGNOSIS — H02403 Unspecified ptosis of bilateral eyelids: Secondary | ICD-10-CM | POA: Insufficient documentation

## 2017-12-10 DIAGNOSIS — F17200 Nicotine dependence, unspecified, uncomplicated: Secondary | ICD-10-CM | POA: Insufficient documentation

## 2017-12-10 DIAGNOSIS — H02833 Dermatochalasis of right eye, unspecified eyelid: Secondary | ICD-10-CM | POA: Diagnosis present

## 2017-12-10 DIAGNOSIS — F419 Anxiety disorder, unspecified: Secondary | ICD-10-CM | POA: Diagnosis not present

## 2017-12-10 DIAGNOSIS — G709 Myoneural disorder, unspecified: Secondary | ICD-10-CM | POA: Diagnosis not present

## 2017-12-10 DIAGNOSIS — E7800 Pure hypercholesterolemia, unspecified: Secondary | ICD-10-CM | POA: Diagnosis not present

## 2017-12-10 DIAGNOSIS — H02836 Dermatochalasis of left eye, unspecified eyelid: Secondary | ICD-10-CM | POA: Insufficient documentation

## 2017-12-10 DIAGNOSIS — I10 Essential (primary) hypertension: Secondary | ICD-10-CM | POA: Insufficient documentation

## 2017-12-10 DIAGNOSIS — M5430 Sciatica, unspecified side: Secondary | ICD-10-CM | POA: Insufficient documentation

## 2017-12-10 HISTORY — DX: Anxiety disorder, unspecified: F41.9

## 2017-12-10 HISTORY — DX: Sciatica, unspecified side: M54.30

## 2017-12-10 HISTORY — DX: Pure hypercholesterolemia, unspecified: E78.00

## 2017-12-10 HISTORY — DX: Dorsalgia, unspecified: M54.9

## 2017-12-10 HISTORY — PX: BROW LIFT: SHX178

## 2017-12-10 HISTORY — PX: PTOSIS REPAIR: SHX6568

## 2017-12-10 SURGERY — BLEPHAROPLASTY
Anesthesia: Monitor Anesthesia Care | Site: Eye | Laterality: Bilateral | Wound class: Clean

## 2017-12-10 MED ORDER — PROPOFOL 500 MG/50ML IV EMUL
INTRAVENOUS | Status: DC | PRN
  Administered 2017-12-10: 50 ug/kg/min via INTRAVENOUS

## 2017-12-10 MED ORDER — ERYTHROMYCIN 5 MG/GM OP OINT
TOPICAL_OINTMENT | OPHTHALMIC | Status: DC | PRN
  Administered 2017-12-10: 1 via OPHTHALMIC

## 2017-12-10 MED ORDER — LACTATED RINGERS IV SOLN
INTRAVENOUS | Status: DC | PRN
  Administered 2017-12-10: 13:00:00 via INTRAVENOUS

## 2017-12-10 MED ORDER — LACTATED RINGERS IV SOLN: INTRAVENOUS | Status: DC

## 2017-12-10 MED ORDER — TETRACAINE HCL 0.5 % OP SOLN
OPHTHALMIC | Status: DC | PRN
  Administered 2017-12-10: 2 [drp] via OPHTHALMIC

## 2017-12-10 MED ORDER — LACTATED RINGERS IV SOLN: 1000.0000 mL | INTRAVENOUS | Status: DC

## 2017-12-10 MED ORDER — LIDOCAINE-EPINEPHRINE 2 %-1:100000 IJ SOLN
INTRAMUSCULAR | Status: DC | PRN
  Administered 2017-12-10: 3 mL via OPHTHALMIC

## 2017-12-10 MED ORDER — ERYTHROMYCIN 5 MG/GM OP OINT: TOPICAL_OINTMENT | OPHTHALMIC | 3 refills | Status: DC

## 2017-12-10 MED ORDER — TRAMADOL HCL 50 MG PO TABS: ORAL_TABLET | ORAL | 0 refills | Status: DC

## 2017-12-10 MED ORDER — BSS IO SOLN
INTRAOCULAR | Status: DC | PRN
  Administered 2017-12-10: 4 mL

## 2017-12-10 MED ORDER — ALFENTANIL 500 MCG/ML IJ INJ
INJECTION | INTRAVENOUS | Status: DC | PRN
  Administered 2017-12-10: 250 ug via INTRAVENOUS
  Administered 2017-12-10: 500 ug via INTRAVENOUS

## 2017-12-10 MED ORDER — MIDAZOLAM HCL 2 MG/2ML IJ SOLN
INTRAMUSCULAR | Status: DC | PRN
  Administered 2017-12-10 (×2): 1 mg via INTRAVENOUS

## 2017-12-10 MED ORDER — DEXMEDETOMIDINE HCL 200 MCG/2ML IV SOLN
INTRAVENOUS | Status: DC | PRN
  Administered 2017-12-10 (×3): 4 ug via INTRAVENOUS

## 2017-12-10 MED ORDER — ACETAMINOPHEN 325 MG PO TABS: 975.0000 mg | ORAL_TABLET | Freq: Once | ORAL | Status: DC

## 2017-12-10 SURGICAL SUPPLY — 36 items
APPLICATOR COTTON TIP WD 3 STR (MISCELLANEOUS) ×6 IMPLANT
BLADE SURG 15 STRL LF DISP TIS (BLADE) ×1 IMPLANT
BLADE SURG 15 STRL SS (BLADE) ×2
CORD BIP STRL DISP 12FT (MISCELLANEOUS) ×3 IMPLANT
DRAPE HEAD BAR (DRAPES) ×3 IMPLANT
GAUZE SPONGE 4X4 12PLY STRL (GAUZE/BANDAGES/DRESSINGS) ×3 IMPLANT
GAUZE SPONGE NON-WVN 2X2 STRL (MISCELLANEOUS) ×10 IMPLANT
GLOVE SURG LX 7.0 MICRO (GLOVE) ×4
GLOVE SURG LX STRL 7.0 MICRO (GLOVE) ×2 IMPLANT
MARKER SKIN XFINE TIP W/RULER (MISCELLANEOUS) ×3 IMPLANT
NEEDLE FILTER BLUNT 18X 1/2SAF (NEEDLE) ×2
NEEDLE FILTER BLUNT 18X1 1/2 (NEEDLE) ×1 IMPLANT
NEEDLE HYPO 30X.5 LL (NEEDLE) ×6 IMPLANT
PACK DRAPE NASAL/ENT (PACKS) ×3 IMPLANT
SOL PREP PVP 2OZ (MISCELLANEOUS) ×3
SOLUTION PREP PVP 2OZ (MISCELLANEOUS) ×1 IMPLANT
SPONGE VERSALON 2X2 STRL (MISCELLANEOUS) ×20
SUT CHROMIC 4-0 (SUTURE)
SUT CHROMIC 4-0 M2 12X2 ARM (SUTURE)
SUT CHROMIC 5 0 P 3 (SUTURE) IMPLANT
SUT ETHILON 4 0 CL P 3 (SUTURE) IMPLANT
SUT MERSILENE 4-0 S-2 (SUTURE) IMPLANT
SUT PDS AB 4-0 P3 18 (SUTURE) IMPLANT
SUT PLAIN GUT (SUTURE) ×3 IMPLANT
SUT PROLENE 5 0 P 3 (SUTURE) IMPLANT
SUT PROLENE 6 0 P 1 18 (SUTURE) ×6 IMPLANT
SUT SILK 4 0 G 3 (SUTURE) IMPLANT
SUT VIC AB 5-0 P-3 18X BRD (SUTURE) IMPLANT
SUT VIC AB 5-0 P3 18 (SUTURE)
SUT VICRYL 6-0  S14 CTD (SUTURE)
SUT VICRYL 6-0 S14 CTD (SUTURE) IMPLANT
SUT VICRYL 7 0 TG140 8 (SUTURE) IMPLANT
SUTURE CHRMC 4-0 M2 12X2 ARM (SUTURE) IMPLANT
SYR 10ML LL (SYRINGE) ×3 IMPLANT
SYR 3ML LL SCALE MARK (SYRINGE) ×3 IMPLANT
WATER STERILE IRR 250ML POUR (IV SOLUTION) ×3 IMPLANT

## 2017-12-10 NOTE — Transfer of Care (Signed)
Immediate Anesthesia Transfer of Care Note  Patient: Todd Beard  Procedure(s) Performed: BLEPHAROPLASTY UPPER EYELID WITH EXCESS SKIN (Bilateral Eye) PTOSIS REPAIR RESECT EX (Bilateral Eye)  Patient Location: PACU  Anesthesia Type: General  Level of Consciousness: awake, alert  and patient cooperative  Airway and Oxygen Therapy: Patient Spontanous Breathing and Patient connected to supplemental oxygen  Post-op Assessment: Post-op Vital signs reviewed, Patient's Cardiovascular Status Stable, Respiratory Function Stable, Patent Airway and No signs of Nausea or vomiting  Post-op Vital Signs: Reviewed and stable  Complications: No apparent anesthesia complications

## 2017-12-10 NOTE — Interval H&P Note (Signed)
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 After consideration of risks, benefits and other options for treatment, the patient has consented to  Procedure(s): BLEPHAROPLASTY UPPER EYELID WITH EXCESS SKIN (Bilateral) PTOSIS REPAIR RESECT EX (Bilateral) as a surgical intervention .  The patient's history has been reviewed, patient examined, no change in status, stable for surgery.  I have reviewed the patient's chart and labs.  Questions were answered to the patient's satisfaction.     Vickki Muff, Amy M

## 2017-12-10 NOTE — Anesthesia Preprocedure Evaluation (Addendum)
Anesthesia Evaluation  Patient identified by MRN, date of birth, ID band Patient awake    Reviewed: Allergy & Precautions, NPO status , Patient's Chart, lab work & pertinent test results, reviewed documented beta blocker date and time   Airway Mallampati: III  TM Distance: >3 FB   Mouth opening: Limited Mouth Opening  Dental  (+) Upper Dentures, Lower Dentures   Pulmonary Current Smoker,    Pulmonary exam normal breath sounds clear to auscultation       Cardiovascular hypertension, Normal cardiovascular exam Rhythm:Regular Rate:Normal     Neuro/Psych Anxiety  Neuromuscular disease    GI/Hepatic negative GI ROS, Neg liver ROS,   Endo/Other  negative endocrine ROS  Renal/GU negative Renal ROS     Musculoskeletal  (+) Arthritis ,   Abdominal Normal abdominal exam  (+)   Peds  Hematology  (+) anemia ,   Anesthesia Other Findings   Reproductive/Obstetrics                            Anesthesia Physical Anesthesia Plan  ASA: II  Anesthesia Plan: MAC   Post-op Pain Management:    Induction: Intravenous  PONV Risk Score and Plan:   Airway Management Planned: Natural Airway  Additional Equipment: None  Intra-op Plan:   Post-operative Plan:   Informed Consent: I have reviewed the patients History and Physical, chart, labs and discussed the procedure including the risks, benefits and alternatives for the proposed anesthesia with the patient or authorized representative who has indicated his/her understanding and acceptance.     Plan Discussed with: CRNA, Anesthesiologist and Surgeon  Anesthesia Plan Comments:        Anesthesia Quick Evaluation

## 2017-12-10 NOTE — Op Note (Signed)
Preoperative Diagnosis:  1. Visually significant blepharoptosis bilateral upper Eyelid(s) 2. Visually significant dermatochalasis bilateral upper Eyelid(s)  Postoperative Diagnosis:  Same.  Procedure(s) Performed:   1. Blepharoptosis repair with levator aponeurosis advancement bilateral upper Eyelid(s) 2. Upper eyelid blepharoplasty with excess skin excision bilateral upper Eyelid(s)  Surgeon: Philis Pique. Vickki Muff, M.D.  Assistants: none  Anesthesia: MAC  Specimens: None.  Estimated Blood Loss: Minimal.  Complications: None.  Operative Findings: None Dictated  Procedure:   Allergies were reviewed and the patient Atenolol; Metoprolol; and Tramadol.   After the risks, benefits, complications and alternatives were discussed with the patient, appropriate informed consent was obtained.  While seated in an upright position and looking in primary gaze, the mid pupillary line was marked on the upper eyelid margins bilaterally. The patient was then brought to the operating suite and reclined supine.  Timeout was conducted and the patient was sedated.  Local anesthetic consisting of a 50-50 mixture of 2% lidocaine with epinephrine and 0.75% bupivacaine with added Hylenex was injected subcutaneously to both upper eyelid(s). After adequate local was instilled, the patient was prepped and draped in the usual sterile fashion for eyelid surgery.   Attention was turned to the upper eyelids. A 65m upper eyelid crease incision line was marked with calipers on both upper eyelid(s).  A pinch test was used to estimate the amount of excess skin to remove and this was marked in standard blepharoplasty style fashion. Attention was turned to the right upper eyelid. A #15 blade was used to open the premarked incision line. A skin only flap was excised and hemostasis was obtained with bipolar cautery.   A buttonhole was created medially in orbicularis and orbital septum to reveal the medial fat pocket. This was  dissected free from fascial attachments, cauterized towards the pedicle base and excised to produce a nice flattening of the medial corner of the upper eyelid.  Westcott scissors were then used to transect through orbicularis down to the tarsal plate. Epitarsus was dissected to create a smooth surface to suture to. Dissection was then carried superiorly in the plane between orbicularis and orbital septum. Once the preaponeurotic fat pocket was identified, the orbital septum was opened. This revealed the levator and its aponeurosis.    Attention was then turned to the opposite eyelid where the same procedure was performed in the same manner. Hemostasis was obtained with bipolar cautery throughout.   3 interrupted 6-0 Prolene sutures were then passed partial thickness through the tarsal plates of both upper eyelid(s). These sutures were placed in line with the mid pupillary, medial limbal, and lateral limbal lines. The sutures were fixed to the levator aponeurosis and adjusted until a nice lid height and contour were achieved. Once nice symmetry was achieved, the skin incisions were closed with a running 6-0 fast absorbing plain suture. The patient tolerated the procedure well.  Erythromycin ophthalmic ointment was applied to the incision site(s) followed by ice packs. The patient was taken to the recovery area where he recovered without difficulty.  Post-Op Plan/Instructions:  The patient was instructed to use ice packs frequently for the next 48 hours. He was instructed to use erythromycin ophthalmic ointment on his incisions 4 times a day for the next 12 to 14 days. Hewas given a prescription for Percocet for pain control should Tylenol not be effective. [facility] [place] [facility] or sooner as needed for problems.  Carter Kassel M. FVickki Muff  M.D. [name]

## 2017-12-10 NOTE — H&P (Signed)
See the history and physical completed at [facility] on 11/22/17 and scanned into the chart.

## 2017-12-10 NOTE — Anesthesia Postprocedure Evaluation (Signed)
Anesthesia Post Note  Patient: Todd Beard  Procedure(s) Performed: BLEPHAROPLASTY UPPER EYELID WITH EXCESS SKIN (Bilateral Eye) PTOSIS REPAIR RESECT EX (Bilateral Eye)  Patient location during evaluation: PACU Anesthesia Type: General Level of consciousness: awake Pain management: pain level controlled Vital Signs Assessment: post-procedure vital signs reviewed and stable Respiratory status: spontaneous breathing Cardiovascular status: blood pressure returned to baseline Postop Assessment: no headache Anesthetic complications: no    Lavonna Monarch

## 2017-12-12 ENCOUNTER — Encounter: Payer: Self-pay | Admitting: Ophthalmology

## 2017-12-12 NOTE — Addendum Note (Signed)
Addendum  created 12/12/17 1557 by Darrin Nipper, MD   Intraprocedure SmartForms edited, Sign clinical note

## 2019-09-02 ENCOUNTER — Other Ambulatory Visit: Payer: Self-pay

## 2019-09-02 ENCOUNTER — Other Ambulatory Visit: Payer: Self-pay | Admitting: Physician Assistant

## 2019-09-02 ENCOUNTER — Ambulatory Visit
Admission: RE | Admit: 2019-09-02 | Discharge: 2019-09-02 | Disposition: A | Discharge status: Home / Self Care | Payer: Medicare Other | Source: Ambulatory Visit | Attending: Physician Assistant | Admitting: Physician Assistant

## 2019-09-02 DIAGNOSIS — R410 Disorientation, unspecified: Secondary | ICD-10-CM | POA: Diagnosis present

## 2020-01-13 ENCOUNTER — Other Ambulatory Visit: Payer: Self-pay | Admitting: Internal Medicine

## 2020-01-13 DIAGNOSIS — N183 Chronic kidney disease, stage 3 unspecified: Secondary | ICD-10-CM

## 2020-01-13 DIAGNOSIS — N179 Acute kidney failure, unspecified: Secondary | ICD-10-CM

## 2020-01-21 ENCOUNTER — Other Ambulatory Visit: Payer: Self-pay

## 2020-01-21 ENCOUNTER — Ambulatory Visit
Admission: RE | Admit: 2020-01-21 | Discharge: 2020-01-21 | Disposition: A | Discharge status: Home / Self Care | Payer: Medicare Other | Source: Ambulatory Visit | Attending: Internal Medicine | Admitting: Internal Medicine

## 2020-01-21 DIAGNOSIS — N179 Acute kidney failure, unspecified: Secondary | ICD-10-CM | POA: Diagnosis present

## 2020-01-21 DIAGNOSIS — N183 Chronic kidney disease, stage 3 unspecified: Secondary | ICD-10-CM | POA: Diagnosis present

## 2020-01-28 ENCOUNTER — Other Ambulatory Visit: Payer: Self-pay | Admitting: Gastroenterology

## 2020-01-28 DIAGNOSIS — K7682 Hepatic encephalopathy: Secondary | ICD-10-CM

## 2020-01-28 DIAGNOSIS — F1010 Alcohol abuse, uncomplicated: Secondary | ICD-10-CM

## 2020-02-03 ENCOUNTER — Other Ambulatory Visit: Payer: Self-pay

## 2020-02-03 ENCOUNTER — Ambulatory Visit
Admission: RE | Admit: 2020-02-03 | Discharge: 2020-02-03 | Disposition: A | Discharge status: Home / Self Care | Payer: Medicare Other | Source: Ambulatory Visit | Attending: Gastroenterology | Admitting: Gastroenterology

## 2020-02-03 ENCOUNTER — Other Ambulatory Visit
Admission: RE | Admit: 2020-02-03 | Discharge: 2020-02-03 | Disposition: A | Discharge status: Home / Self Care | Payer: Medicare Other | Source: Ambulatory Visit | Attending: Gastroenterology | Admitting: Gastroenterology

## 2020-02-03 DIAGNOSIS — K7682 Hepatic encephalopathy: Secondary | ICD-10-CM

## 2020-02-03 DIAGNOSIS — Z01812 Encounter for preprocedural laboratory examination: Secondary | ICD-10-CM | POA: Insufficient documentation

## 2020-02-03 DIAGNOSIS — Z20822 Contact with and (suspected) exposure to covid-19: Secondary | ICD-10-CM | POA: Diagnosis not present

## 2020-02-03 DIAGNOSIS — F1010 Alcohol abuse, uncomplicated: Secondary | ICD-10-CM

## 2020-02-03 LAB — SARS CORONAVIRUS 2 (TAT 6-24 HRS): SARS Coronavirus 2: NEGATIVE

## 2020-02-04 ENCOUNTER — Ambulatory Visit
Admission: RE | Admit: 2020-02-04 | Discharge: 2020-02-04 | Disposition: A | Discharge status: Home / Self Care | Payer: Medicare Other | Source: Ambulatory Visit | Attending: Gastroenterology | Admitting: Gastroenterology

## 2020-02-04 ENCOUNTER — Other Ambulatory Visit: Payer: Self-pay

## 2020-02-04 DIAGNOSIS — F1010 Alcohol abuse, uncomplicated: Secondary | ICD-10-CM | POA: Diagnosis not present

## 2020-02-04 DIAGNOSIS — K7290 Hepatic failure, unspecified without coma: Secondary | ICD-10-CM | POA: Diagnosis present

## 2020-02-05 ENCOUNTER — Encounter
Admission: RE | Disposition: A | Discharge status: Home / Self Care | Payer: Self-pay | Source: Home / Self Care | Attending: Gastroenterology

## 2020-02-05 ENCOUNTER — Ambulatory Visit: Payer: Medicare Other | Admitting: Anesthesiology

## 2020-02-05 ENCOUNTER — Encounter: Payer: Self-pay | Admitting: *Deleted

## 2020-02-05 ENCOUNTER — Ambulatory Visit
Admission: RE | Admit: 2020-02-05 | Discharge: 2020-02-05 | Disposition: A | Discharge status: Home / Self Care | Payer: Medicare Other | Attending: Gastroenterology | Admitting: Gastroenterology

## 2020-02-05 ENCOUNTER — Other Ambulatory Visit: Payer: Self-pay

## 2020-02-05 DIAGNOSIS — C187 Malignant neoplasm of sigmoid colon: Secondary | ICD-10-CM | POA: Insufficient documentation

## 2020-02-05 DIAGNOSIS — E7800 Pure hypercholesterolemia, unspecified: Secondary | ICD-10-CM | POA: Diagnosis not present

## 2020-02-05 DIAGNOSIS — K648 Other hemorrhoids: Secondary | ICD-10-CM | POA: Diagnosis not present

## 2020-02-05 DIAGNOSIS — F419 Anxiety disorder, unspecified: Secondary | ICD-10-CM | POA: Insufficient documentation

## 2020-02-05 DIAGNOSIS — I10 Essential (primary) hypertension: Secondary | ICD-10-CM | POA: Diagnosis not present

## 2020-02-05 DIAGNOSIS — K7030 Alcoholic cirrhosis of liver without ascites: Secondary | ICD-10-CM | POA: Insufficient documentation

## 2020-02-05 DIAGNOSIS — R195 Other fecal abnormalities: Secondary | ICD-10-CM | POA: Insufficient documentation

## 2020-02-05 DIAGNOSIS — F17200 Nicotine dependence, unspecified, uncomplicated: Secondary | ICD-10-CM | POA: Diagnosis not present

## 2020-02-05 DIAGNOSIS — Z79899 Other long term (current) drug therapy: Secondary | ICD-10-CM | POA: Insufficient documentation

## 2020-02-05 HISTORY — PX: COLONOSCOPY WITH PROPOFOL: SHX5780

## 2020-02-05 LAB — PROTIME-INR
INR: 1.1 (ref 0.8–1.2)
Prothrombin Time: 13.5 seconds (ref 11.4–15.2)

## 2020-02-05 SURGERY — COLONOSCOPY WITH PROPOFOL
Anesthesia: General

## 2020-02-05 MED ORDER — PROPOFOL 500 MG/50ML IV EMUL
INTRAVENOUS | Status: AC
  Filled 2020-02-05: qty 50

## 2020-02-05 MED ORDER — IPRATROPIUM-ALBUTEROL 0.5-2.5 (3) MG/3ML IN SOLN
RESPIRATORY_TRACT | Status: AC
  Filled 2020-02-05: qty 3

## 2020-02-05 MED ORDER — MIDAZOLAM HCL 2 MG/2ML IJ SOLN
INTRAMUSCULAR | Status: AC
  Filled 2020-02-05: qty 2

## 2020-02-05 MED ORDER — LIDOCAINE HCL (PF) 2 % IJ SOLN
INTRAMUSCULAR | Status: AC
  Filled 2020-02-05: qty 5

## 2020-02-05 MED ORDER — PROPOFOL 10 MG/ML IV BOLUS
INTRAVENOUS | Status: DC | PRN
  Administered 2020-02-05: 20 mg via INTRAVENOUS

## 2020-02-05 MED ORDER — MIDAZOLAM HCL 2 MG/2ML IJ SOLN
INTRAMUSCULAR | Status: DC | PRN
  Administered 2020-02-05: 2 mg via INTRAVENOUS

## 2020-02-05 MED ORDER — FENTANYL CITRATE (PF) 100 MCG/2ML IJ SOLN
INTRAMUSCULAR | Status: AC
  Filled 2020-02-05: qty 2

## 2020-02-05 MED ORDER — FENTANYL CITRATE (PF) 100 MCG/2ML IJ SOLN
INTRAMUSCULAR | Status: DC | PRN
  Administered 2020-02-05 (×2): 25 ug via INTRAVENOUS
  Administered 2020-02-05: 50 ug via INTRAVENOUS

## 2020-02-05 MED ORDER — SODIUM CHLORIDE 0.9 % IV SOLN
INTRAVENOUS | Status: DC
  Administered 2020-02-05: 1000 mL via INTRAVENOUS

## 2020-02-05 MED ORDER — LIDOCAINE HCL (CARDIAC) PF 100 MG/5ML IV SOSY
PREFILLED_SYRINGE | INTRAVENOUS | Status: DC | PRN
  Administered 2020-02-05: 80 mg via INTRAVENOUS

## 2020-02-05 MED ORDER — PROPOFOL 500 MG/50ML IV EMUL
INTRAVENOUS | Status: DC | PRN
  Administered 2020-02-05: 50 ug/kg/min via INTRAVENOUS

## 2020-02-05 NOTE — Interval H&P Note (Signed)
History and Physical Interval Note:  02/05/2020 2:48 PM  Todd Beard  has presented today for surgery, with the diagnosis of POSITIVE FIT.  The various methods of treatment have been discussed with the patient and family. After consideration of risks, benefits and other options for treatment, the patient has consented to  Procedure(s): COLONOSCOPY WITH PROPOFOL (N/A) as a surgical intervention.  The patient's history has been reviewed, patient examined, no change in status, stable for surgery.  I have reviewed the patient's chart and labs.  Questions were answered to the patient's satisfaction.     [name]  Ok to proceed with colonoscopy.

## 2020-02-05 NOTE — Anesthesia Preprocedure Evaluation (Signed)
Anesthesia Evaluation  Patient identified by MRN, date of birth, ID band Patient awake    Reviewed: Allergy & Precautions, H&P , NPO status , Patient's Chart, lab work & pertinent test results  History of Anesthesia Complications Negative for: history of anesthetic complications  Airway Mallampati: III  TM Distance: <3 FB Neck ROM: limited    Dental  (+) Upper Dentures, Lower Dentures   Pulmonary COPD, Current Smoker and Patient abstained from smoking.,    Pulmonary exam normal        Cardiovascular Exercise Tolerance: Good hypertension, (-) angina(-) Past MI and (-) DOE Normal cardiovascular exam     Neuro/Psych PSYCHIATRIC DISORDERS  Neuromuscular disease    GI/Hepatic negative GI ROS, Neg liver ROS, neg GERD  ,  Endo/Other  negative endocrine ROS  Renal/GU negative Renal ROS  negative genitourinary   Musculoskeletal   Abdominal   Peds  Hematology negative hematology ROS (+)   Anesthesia Other Findings Past Medical History: No date: Anxiety No date: Back pain with sciatica     Comment:  and leg No date: Hypercholesterolemia No date: Hypertension  Past Surgical History: 12/10/2017: BROW LIFT; Bilateral     Comment:  Procedure: BLEPHAROPLASTY UPPER EYELID WITH EXCESS SKIN;              Surgeon: Karle Starch, MD;  Location: [place];  Service: Ophthalmology;  Laterality: Bilateral;                bilateral  12/10/2017: PTOSIS REPAIR; Bilateral     Comment:  Procedure: PTOSIS REPAIR RESECT EX;  Surgeon: Karle Starch, MD;  Location: [place];  Service:               Ophthalmology;  Laterality: Bilateral; No date: TOTAL HIP ARTHROPLASTY; Left  BMI    Body Mass Index: 27.72 kg/m      Reproductive/Obstetrics negative OB ROS                             Anesthesia Physical Anesthesia Plan  ASA: III  Anesthesia Plan: General    Post-op Pain Management:    Induction: Intravenous  PONV Risk Score and Plan: Propofol infusion and TIVA  Airway Management Planned: Natural Airway and Nasal Cannula  Additional Equipment:   Intra-op Plan:   Post-operative Plan:   Informed Consent: I have reviewed the patients History and Physical, chart, labs and discussed the procedure including the risks, benefits and alternatives for the proposed anesthesia with the patient or authorized representative who has indicated his/her understanding and acceptance.     Dental Advisory Given  Plan Discussed with: Anesthesiologist, CRNA and Surgeon  Anesthesia Plan Comments: (Patient consented for risks of anesthesia including but not limited to:  - adverse reactions to medications - risk of intubation if required - damage to eyes, teeth, lips or other oral mucosa - nerve damage due to positioning  - sore throat or hoarseness - Damage to heart, brain, nerves, lungs, other parts of body or loss of life  Patient voiced understanding.)        Anesthesia Quick Evaluation

## 2020-02-05 NOTE — Op Note (Signed)
[facility] Gastroenterology Patient Name: Todd Beard Procedure Date: 02/05/2020 2:36 PM MRN: [id number] Account #: [id number] Date of Birth: 17-Feb-1951 Admit Type: Outpatient Age: 69 Room: [facility] ENDO ROOM 3 Gender: Male Note Status: Finalized Procedure:             Colonoscopy Indications:           Positive fecal immunochemical test Providers:             Andrey Farmer MD, MD Medicines:             Monitored Anesthesia Care Complications:         No immediate complications. Estimated blood loss:                         Minimal. Procedure:             Pre-Anesthesia Assessment:                        - Prior to the procedure, a History and Physical was                         performed, and patient medications and allergies were                         reviewed. The patient is competent. The risks and                         benefits of the procedure and the sedation options and                         risks were discussed with the patient. All questions                         were answered and informed consent was obtained.                         Patient identification and proposed procedure were                         verified by the physician, the nurse, the anesthetist                         and the technician in the endoscopy suite. Mental                         Status Examination: alert and oriented. Airway                         Examination: normal oropharyngeal airway and neck                         mobility. Respiratory Examination: clear to                         auscultation. CV Examination: normal. Prophylactic                         Antibiotics: The patient does not require prophylactic  antibiotics. Prior Anticoagulants: The patient has                         taken no previous anticoagulant or antiplatelet                         agents. ASA Grade Assessment: III - A patient with                         severe  systemic disease. After reviewing the risks and                         benefits, the patient was deemed in satisfactory                         condition to undergo the procedure. The anesthesia                         plan was to use monitored anesthesia care (MAC).                         Immediately prior to administration of medications,                         the patient was re-assessed for adequacy to receive                         sedatives. The heart rate, respiratory rate, oxygen                         saturations, blood pressure, adequacy of pulmonary                         ventilation, and response to care were monitored                         throughout the procedure. The physical status of the                         patient was re-assessed after the procedure.                        After obtaining informed consent, the colonoscope was                         passed under direct vision. Throughout the procedure,                         the patient's blood pressure, pulse, and oxygen                         saturations were monitored continuously. The                         Colonoscope was introduced through the anus and                         advanced to the the sigmoid colon. The colonoscopy was  performed without difficulty. The patient tolerated                         the procedure well. The quality of the bowel                         preparation was adequate to identify polyps. Findings:      The perianal and digital rectal examinations were normal.      A 25 mm polyp was found in the sigmoid colon. The polyp was       semi-pedunculated. The polyp was removed with a hot snare. Polyp       resection was incomplete as it was removed in a piecemeal fashion. The       resected tissue was retrieved. Estimated blood loss was minimal. To       prevent bleeding post-intervention, four hemostatic clips were       successfully placed. There was no  bleeding at the end of the procedure.      Non-bleeding internal hemorrhoids were found during retroflexion. The       hemorrhoids were moderate. Impression:            - One 25 mm polyp in the sigmoid colon, removed with a                         hot snare. Incomplete resection. Resected tissue                         retrieved. Clips were placed.                        - Non-bleeding internal hemorrhoids. Recommendation:        - Repeat colonoscopy in 3 months to review the                         polypectomy site and to complete exam.                        - Return to referring physician as previously                         scheduled.                        - Await pathology results. Procedure Code(s):     --- Professional ---                        [phone], 52, Colonoscopy, flexible; with removal of                         tumor(s), polyp(s), or other lesion(s) by snare                         technique Diagnosis Code(s):     --- Professional ---                        K63.5, Polyp of colon                        K64.8, Other hemorrhoids  R19.5, Other fecal abnormalities CPT copyright 2019 American Medical Association. All rights reserved. The codes documented in this report are preliminary and upon coder review may  be revised to meet current compliance requirements. Andrey Farmer, MD Andrey Farmer MD, MD 02/05/2020 3:38:24 PM Number of Addenda: 0 Note Initiated On: 02/05/2020 2:36 PM Total Procedure Duration: 0 hours 33 minutes 29 seconds  Estimated Blood Loss:  Estimated blood loss was minimal.      [facility]

## 2020-02-05 NOTE — OR Nursing (Signed)
Pt unable to maintain O2 SAT even after awakening. Sat down to 88% ROOM AIR. rn has tried incentive spirometer [phone] for 15 minutes. Dr Rosey Bath NOTIFIED . MD ORDERED SVN TX. MEDICATION ADM WITH NO CHANGE IN PATIENT SATS. PT AMBUATED AROUND NURSING STATION . SATS DROPPED BACK DOWN TO 88 AFTER SITTING 2 MINUTES. . CDB EXERCISES  PERFORMED.

## 2020-02-05 NOTE — Transfer of Care (Signed)
Immediate Anesthesia Transfer of Care Note  Patient: Braxtin S Hagemeister  Procedure(s) Performed: COLONOSCOPY WITH PROPOFOL (N/A )  Patient Location: PACU and Endoscopy Unit  Anesthesia Type:General  Level of Consciousness: sedated  Airway & Oxygen Therapy: Patient Spontanous Breathing and Patient connected to face mask oxygen  Post-op Assessment: Report given to RN and Post -op Vital signs reviewed and stable  Post vital signs: Reviewed and stable  Last Vitals:  Vitals Value Taken Time  BP 89/72 02/05/20 1537  Temp    Pulse 64 02/05/20 1538  Resp 14 02/05/20 1538  SpO2 90 % 02/05/20 1538  Vitals shown include unvalidated device data.  Last Pain:  Vitals:   02/05/20 1535  TempSrc:   PainSc: 0-No pain      Patients Stated Pain Goal: 0 (50/75/73 2256)  Complications: No complications documented.

## 2020-02-05 NOTE — OR Nursing (Signed)
PT ABLE TO MAINTAIN SATS ON ROOM AIR WITH O2 SATS 93-100. EVEN WITH CONVERSATION. PT DISCHARGED HOME WITH INCENTIVE SPIROMTER TO USE Q1H W/A. CGER INFORMED

## 2020-02-05 NOTE — H&P (Signed)
Outpatient short stay form Pre-procedure 02/05/2020 2:46 PM Raylene Miyamoto MD, MPH  Primary Physician: Dr. 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 Never had colonoscopy before. No blood thinners.    Current Facility-Administered Medications:    0.9 %  sodium chloride infusion, , Intravenous, Continuous, Cristalle Rohm, Hilton Cork, MD, Last Rate: 20 mL/hr at 02/05/20 1409, 1,000 mL at 02/05/20 1409  Medications Prior to Admission  Medication Sig Dispense Refill Last Dose   ALPRAZolam (XANAX) 1 MG tablet Take 1 mg by mouth 3 (three) times daily.    02/05/2020 at Unknown time   amLODipine (NORVASC) 5 MG tablet TAKE ONE (1) TABLET BY MOUTH EVERY DAY   02/05/2020 at Unknown time   atorvastatin (LIPITOR) 10 MG tablet   10 02/05/2020 at Unknown time   carvedilol (COREG) 12.5 MG tablet 12.5 mg 2 (two) times daily with a meal.   10 02/05/2020 at Unknown time   cyclobenzaprine (FLEXERIL) 10 MG tablet Take 10 mg by mouth 3 (three) times daily.   02/05/2020 at Unknown time   erythromycin [facility]) ophthalmic ointment Use a small amount on your sutures 4 times a day for the next 2 weeks. Switch to Aquaphor ointment should allergy develop. 3.5 g 3 Past Week at Unknown time   folic acid (FOLVITE) 1 MG tablet Take 1 tablet (1 mg total) by mouth daily. 30 tablet 2 Past Week at Unknown time   gabapentin (NEURONTIN) 100 MG capsule Take 200 mg by mouth 3 (three) times daily.   02/05/2020 at Unknown time   magnesium oxide (MAG-OX) 400 MG tablet Take 400 mg by mouth 2 (two) times daily.   02/05/2020 at Unknown time   potassium chloride (MICRO-K) 10 MEQ CR capsule   10    rosuvastatin (CRESTOR) 10 MG tablet Take 10 mg by mouth daily.      traMADol (ULTRAM) 50 MG tablet Take by mouth 2 (two) times daily.      traMADol (ULTRAM) 50 MG tablet Take 1 every 4-6 hours as needed for pain not controlled by  Tylenol 6 tablet 0      Allergies  Allergen Reactions   Atenolol Other (See Comments)    Mood altering   Metoprolol Other (See Comments)    Mood altering   Tramadol Other (See Comments)    Not metabolized     Past Medical History:  Diagnosis Date   Anxiety    Back pain with sciatica    and leg   Hypercholesterolemia    Hypertension     Review of systems:  Otherwise negative.    Physical Exam  Gen: Alert, oriented. Appears stated age.  HEENT: [place]/AT. PERRLA. Lungs: No respiratory distress Abd: soft, benign, no masses. BS+ Ext: No edema. Pulses 2+    Planned procedures: Proceed with colonoscopy. The patient understands the nature of the planned procedure, indications, risks, alternatives and potential complications including but not limited to bleeding, infection, perforation, damage to internal organs and possible oversedation/side effects from anesthesia. The patient agrees and gives consent to proceed.  Please refer to procedure notes for findings, recommendations and patient disposition/instructions.     Raylene Miyamoto MD, MPH Gastroenterology 02/05/2020  2:46 PM

## 2020-02-07 NOTE — Anesthesia Postprocedure Evaluation (Signed)
Anesthesia Post Note  Patient: Todd Beard  Procedure(s) Performed: COLONOSCOPY WITH PROPOFOL (N/A )  Patient location during evaluation: PACU Anesthesia Type: General Level of consciousness: awake and alert Pain management: pain level controlled Vital Signs Assessment: post-procedure vital signs reviewed and stable Respiratory status: spontaneous breathing, nonlabored ventilation, respiratory function stable and patient connected to nasal cannula oxygen Cardiovascular status: blood pressure returned to baseline and stable Postop Assessment: no apparent nausea or vomiting Anesthetic complications: no   No complications documented.   Last Vitals:  Vitals:   02/05/20 1648 02/05/20 1700  BP:    Pulse:    Resp:    Temp:    SpO2: 98% 94%    Last Pain:  Vitals:   02/06/20 0802  TempSrc:   PainSc: 0-No pain                 Martha Clan

## 2020-02-09 ENCOUNTER — Other Ambulatory Visit: Payer: Self-pay | Admitting: Anatomic Pathology & Clinical Pathology

## 2020-02-13 ENCOUNTER — Other Ambulatory Visit: Payer: Self-pay | Admitting: Gastroenterology

## 2020-02-13 DIAGNOSIS — C187 Malignant neoplasm of sigmoid colon: Secondary | ICD-10-CM

## 2020-02-18 ENCOUNTER — Other Ambulatory Visit: Payer: Self-pay

## 2020-02-18 ENCOUNTER — Other Ambulatory Visit
Admission: RE | Admit: 2020-02-18 | Discharge: 2020-02-18 | Disposition: A | Discharge status: Home / Self Care | Payer: Medicare Other | Source: Ambulatory Visit | Attending: Gastroenterology | Admitting: Gastroenterology

## 2020-02-18 ENCOUNTER — Other Ambulatory Visit: Payer: Medicare Other

## 2020-02-18 DIAGNOSIS — Z01812 Encounter for preprocedural laboratory examination: Secondary | ICD-10-CM | POA: Diagnosis present

## 2020-02-18 DIAGNOSIS — Z20822 Contact with and (suspected) exposure to covid-19: Secondary | ICD-10-CM | POA: Insufficient documentation

## 2020-02-19 ENCOUNTER — Encounter: Payer: Self-pay | Admitting: *Deleted

## 2020-02-19 LAB — SARS CORONAVIRUS 2 (TAT 6-24 HRS): SARS Coronavirus 2: NEGATIVE

## 2020-02-19 NOTE — Progress Notes (Signed)
Tumor Board Documentation  Raydan STYLES FAMBRO was presented by Verlon Au, RN at our Tumor Board on 02/18/2020, which included representatives from medical oncology, radiation oncology, surgical, radiology, pathology, navigation, internal medicine, palliative care, research, pulmonology.  Cephas currently presents as an external consult, for [place], for new positive pathology with history of the following treatments: surgical intervention(s), active survellience.  Additionally, we reviewed previous medical and familial history, history of present illness, and recent lab results along with all available histopathologic and imaging studies. The tumor board considered available treatment options and made the following recommendations: Surgery, Additional screening    The following procedures/referrals were also placed: No orders of the defined types were placed in this encounter.   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  Tumor board is a meeting of clinicians from various specialty areas who evaluate and discuss patients for whom a multidisciplinary approach is being considered. Final determinations in the plan of care are those of the provider(s). The responsibility for follow up of recommendations given during tumor board is that of the provider.   Today's extended care, comprehensive team conference, Jerritt was not present for the discussion and was not examined.   Multidisciplinary Tumor Board is a multidisciplinary case peer review process.  Decisions discussed in the Multidisciplinary Tumor Board reflect the opinions of the specialists present at the conference without having examined the patient.  Ultimately, treatment and diagnostic decisions rest with the primary provider(s) and the  patient.

## 2020-02-22 ENCOUNTER — Ambulatory Visit: Payer: Medicare Other | Admitting: Anesthesiology

## 2020-02-22 ENCOUNTER — Other Ambulatory Visit: Payer: Self-pay

## 2020-02-22 ENCOUNTER — Encounter
Admission: RE | Disposition: A | Discharge status: Home / Self Care | Payer: Self-pay | Source: Home / Self Care | Attending: Gastroenterology

## 2020-02-22 ENCOUNTER — Encounter: Payer: Self-pay | Admitting: *Deleted

## 2020-02-22 ENCOUNTER — Ambulatory Visit
Admission: RE | Admit: 2020-02-22 | Discharge: 2020-02-22 | Disposition: A | Discharge status: Home / Self Care | Payer: Medicare Other | Attending: Gastroenterology | Admitting: Gastroenterology

## 2020-02-22 DIAGNOSIS — K6389 Other specified diseases of intestine: Secondary | ICD-10-CM | POA: Insufficient documentation

## 2020-02-22 DIAGNOSIS — D123 Benign neoplasm of transverse colon: Secondary | ICD-10-CM | POA: Insufficient documentation

## 2020-02-22 DIAGNOSIS — I8500 Esophageal varices without bleeding: Secondary | ICD-10-CM | POA: Diagnosis not present

## 2020-02-22 DIAGNOSIS — K7030 Alcoholic cirrhosis of liver without ascites: Secondary | ICD-10-CM | POA: Insufficient documentation

## 2020-02-22 DIAGNOSIS — E7800 Pure hypercholesterolemia, unspecified: Secondary | ICD-10-CM | POA: Diagnosis not present

## 2020-02-22 DIAGNOSIS — Z888 Allergy status to other drugs, medicaments and biological substances status: Secondary | ICD-10-CM | POA: Insufficient documentation

## 2020-02-22 DIAGNOSIS — Z79899 Other long term (current) drug therapy: Secondary | ICD-10-CM | POA: Diagnosis not present

## 2020-02-22 DIAGNOSIS — K766 Portal hypertension: Secondary | ICD-10-CM | POA: Diagnosis not present

## 2020-02-22 DIAGNOSIS — D124 Benign neoplasm of descending colon: Secondary | ICD-10-CM | POA: Insufficient documentation

## 2020-02-22 DIAGNOSIS — K3189 Other diseases of stomach and duodenum: Secondary | ICD-10-CM | POA: Insufficient documentation

## 2020-02-22 DIAGNOSIS — F419 Anxiety disorder, unspecified: Secondary | ICD-10-CM | POA: Diagnosis not present

## 2020-02-22 DIAGNOSIS — Z885 Allergy status to narcotic agent status: Secondary | ICD-10-CM | POA: Insufficient documentation

## 2020-02-22 DIAGNOSIS — I10 Essential (primary) hypertension: Secondary | ICD-10-CM | POA: Diagnosis not present

## 2020-02-22 DIAGNOSIS — C187 Malignant neoplasm of sigmoid colon: Secondary | ICD-10-CM | POA: Insufficient documentation

## 2020-02-22 HISTORY — PX: COLONOSCOPY WITH PROPOFOL: SHX5780

## 2020-02-22 HISTORY — PX: ESOPHAGOGASTRODUODENOSCOPY (EGD) WITH PROPOFOL: SHX5813

## 2020-02-22 SURGERY — COLONOSCOPY WITH PROPOFOL
Anesthesia: General

## 2020-02-22 MED ORDER — VASOPRESSIN 20 UNIT/ML IV SOLN
INTRAVENOUS | Status: DC | PRN
  Administered 2020-02-22 (×6): 2 [IU] via INTRAVENOUS

## 2020-02-22 MED ORDER — PROPOFOL 10 MG/ML IV BOLUS
INTRAVENOUS | Status: AC
  Filled 2020-02-22: qty 20

## 2020-02-22 MED ORDER — PHENYLEPHRINE HCL (PRESSORS) 10 MG/ML IV SOLN
INTRAVENOUS | Status: DC | PRN
  Administered 2020-02-22: 100 ug via INTRAVENOUS

## 2020-02-22 MED ORDER — EPHEDRINE SULFATE 50 MG/ML IJ SOLN
INTRAMUSCULAR | Status: DC | PRN
  Administered 2020-02-22 (×2): 10 mg via INTRAVENOUS

## 2020-02-22 MED ORDER — VASOPRESSIN 20 UNIT/ML IV SOLN
INTRAVENOUS | Status: AC
  Filled 2020-02-22: qty 1

## 2020-02-22 MED ORDER — PROPOFOL 500 MG/50ML IV EMUL
INTRAVENOUS | Status: DC | PRN
  Administered 2020-02-22: 150 ug/kg/min via INTRAVENOUS

## 2020-02-22 MED ORDER — LIDOCAINE HCL (CARDIAC) PF 100 MG/5ML IV SOSY
PREFILLED_SYRINGE | INTRAVENOUS | Status: DC | PRN
  Administered 2020-02-22: 50 mg via INTRAVENOUS

## 2020-02-22 MED ORDER — PROPOFOL 10 MG/ML IV BOLUS
INTRAVENOUS | Status: DC | PRN
  Administered 2020-02-22: 20 mg via INTRAVENOUS
  Administered 2020-02-22: 40 mg via INTRAVENOUS

## 2020-02-22 MED ORDER — SODIUM CHLORIDE 0.9 % IV SOLN: INTRAVENOUS | Status: DC

## 2020-02-22 NOTE — Op Note (Addendum)
[facility] Gastroenterology Patient Name: Todd Beard Procedure Date: 02/22/2020 12:19 PM MRN: [id number] Account #: [id number] Date of Birth: 29-Mar-1951 Admit Type: Inpatient Age: 69 Room: [facility] ENDO ROOM 3 Gender: Male Note Status: Finalized Procedure:             Colonoscopy Indications:           Cancer of the sigmoid colon Providers:             Andrey Farmer MD, MD Medicines:             Monitored Anesthesia Care Complications:         No immediate complications. Estimated blood loss:                         Minimal. Procedure:             Pre-Anesthesia Assessment:                        - Prior to the procedure, a History and Physical was                         performed, and patient medications and allergies were                         reviewed. The patient is competent. The risks and                         benefits of the procedure and the sedation options and                         risks were discussed with the patient. All questions                         were answered and informed consent was obtained.                         Patient identification and proposed procedure were                         verified by the physician, the nurse, the anesthetist                         and the technician in the endoscopy suite. Mental                         Status Examination: alert and oriented. Airway                         Examination: normal oropharyngeal airway and neck                         mobility. Respiratory Examination: clear to                         auscultation. CV Examination: normal. Prophylactic                         Antibiotics: The patient does not require prophylactic  antibiotics. Prior Anticoagulants: The patient has                         taken no previous anticoagulant or antiplatelet                         agents. ASA Grade Assessment: III - A patient with                         severe  systemic disease. After reviewing the risks and                         benefits, the patient was deemed in satisfactory                         condition to undergo the procedure. The anesthesia                         plan was to use monitored anesthesia care (MAC).                         Immediately prior to administration of medications,                         the patient was re-assessed for adequacy to receive                         sedatives. The heart rate, respiratory rate, oxygen                         saturations, blood pressure, adequacy of pulmonary                         ventilation, and response to care were monitored                         throughout the procedure. The physical status of the                         patient was re-assessed after the procedure.                        After obtaining informed consent, the colonoscope was                         passed under direct vision. Throughout the procedure,                         the patient's blood pressure, pulse, and oxygen                         saturations were monitored continuously. The                         Colonoscope was introduced through the anus and                         advanced to the the terminal ileum. The colonoscopy  was technically difficult and complex due to                         inadequate bowel prep. Successful completion of the                         procedure was aided by lavage. The patient tolerated                         the procedure well. The quality of the bowel                         preparation was poor. Findings:      The perianal and digital rectal examinations were normal.      The terminal ileum appeared normal.      A localized area of moderately nodular mucosa was found at the ileocecal       valve. Biopsies were taken with a cold forceps for histology. Estimated       blood loss was minimal.      A 5 mm polyp was found in the cecum. The  polyp was sessile. The polyp       was removed with a cold snare. Resection was complete, but the polyp       tissue was possibly retrieved. Estimated blood loss was minimal.      A 10 mm polyp was found in the transverse colon. The polyp was sessile.       The polyp was removed with a lift and cut technique using a hot snare.       Tech initially used [place] ink and then used eleview to lift so site is       marked with tattoo (see pictures) Resection and retrieval were complete.       Estimated blood loss was minimal.      A 4 mm polyp was found in the descending colon. The polyp was sessile.       The polyp was removed with a cold snare. Resection and retrieval were       complete. Estimated blood loss was minimal.      A 7 mm polyp was found in the sigmoid colon. The polyp was sessile. The       polyp was removed with a hot snare. Resection and retrieval were       complete. Estimated blood loss was minimal. This was consistent with       previous polyp with path showing invasive adenocarcinoma. This area was       tattooed 3-4 cm distal and proximal to this area. This was about 20 cm       from the anal verge.      A 2 mm polyp was found in the sigmoid colon. The polyp was sessile. The       polyp was removed with a jumbo cold forceps. Resection and retrieval       were complete. Estimated blood loss was minimal.      A 3 mm polyp was found in the sigmoid colon. The polyp was sessile. The       polyp was removed with a jumbo cold forceps. Resection and retrieval       were complete. Estimated blood loss was minimal.      The exam was otherwise without abnormality. Impression:            -  Preparation of the colon was poor.                        - The examined portion of the ileum was normal.                        - Nodular mucosa at the ileocecal valve. Biopsied.                        - One 5 mm polyp in the cecum, removed with a cold                         snare. Complete  resection. Polyp tissue not retrieved.                        - One 10 mm polyp in the transverse colon, removed                         using lift and cut and a hot snare. Resected and                         retrieved.                        - One 4 mm polyp in the descending colon, removed with                         a cold snare. Resected and retrieved.                        - One 7 mm polyp in the sigmoid colon, removed with a                         hot snare. Resected and retrieved.                        - One 2 mm polyp in the sigmoid colon, removed with a                         jumbo cold forceps. Resected and retrieved.                        - One 3 mm polyp in the sigmoid colon, removed with a                         jumbo cold forceps. Resected and retrieved.                        - The examination was otherwise normal. Recommendation:        - Discharge patient to home.                        - Resume previous diet.                        - Continue present medications.                        -  Await pathology results.                        - Refer to a surgeon at appointment to be scheduled. Procedure Code(s):     --- Professional ---                        [phone], Colonoscopy, flexible; with removal of                         tumor(s), polyp(s), or other lesion(s) by snare                         technique                        [id number], 67, Colonoscopy, flexible; with biopsy, single                         or multiple Diagnosis Code(s):     --- Professional ---                        K63.89, Other specified diseases of intestine                        K63.5, Polyp of colon                        C18.7, Malignant neoplasm of sigmoid colon CPT copyright 2019 American Medical Association. All rights reserved. The codes documented in this report are preliminary and upon coder review may  be revised to meet current compliance requirements. Andrey Farmer, MD Andrey Farmer MD, MD 02/22/2020 2:55:10 PM Number of Addenda: 0 Note Initiated On: 02/22/2020 12:19 PM Scope Withdrawal Time: 0 hours 34 minutes 49 seconds  Total Procedure Duration: 0 hours 48 minutes 11 seconds  Estimated Blood Loss:  Estimated blood loss was minimal.      [facility]

## 2020-02-22 NOTE — Op Note (Signed)
[facility] Gastroenterology Patient Name: Todd Beard Procedure Date: 02/22/2020 12:20 PM MRN: [id number] Account #: [id number] Date of Birth: 01/07/1951 Admit Type: Outpatient Age: 69 Room: [facility] ENDO ROOM 3 Gender: Male Note Status: Finalized Procedure:             Upper GI endoscopy Indications:           Esophageal varices Providers:             Andrey Farmer MD, MD Medicines:             Monitored Anesthesia Care Complications:         No immediate complications. Procedure:             Pre-Anesthesia Assessment:                        - Prior to the procedure, a History and Physical was                         performed, and patient medications and allergies were                         reviewed. The patient is competent. The risks and                         benefits of the procedure and the sedation options and                         risks were discussed with the patient. All questions                         were answered and informed consent was obtained.                         Patient identification and proposed procedure were                         verified by the physician, the nurse, the anesthetist                         and the technician in the endoscopy suite. Mental                         Status Examination: alert and oriented. Airway                         Examination: normal oropharyngeal airway and neck                         mobility. Respiratory Examination: clear to                         auscultation. CV Examination: normal. Prophylactic                         Antibiotics: The patient does not require prophylactic                         antibiotics. Prior Anticoagulants: The patient has  taken no previous anticoagulant or antiplatelet                         agents. ASA Grade Assessment: III - A patient with                         severe systemic disease. After reviewing the risks and                          benefits, the patient was deemed in satisfactory                         condition to undergo the procedure. The anesthesia                         plan was to use monitored anesthesia care (MAC).                         Immediately prior to administration of medications,                         the patient was re-assessed for adequacy to receive                         sedatives. The heart rate, respiratory rate, oxygen                         saturations, blood pressure, adequacy of pulmonary                         ventilation, and response to care were monitored                         throughout the procedure. The physical status of the                         patient was re-assessed after the procedure.                        After obtaining informed consent, the endoscope was                         passed under direct vision. Throughout the procedure,                         the patient's blood pressure, pulse, and oxygen                         saturations were monitored continuously. The Endoscope                         was introduced through the mouth, and advanced to the                         second part of duodenum. The upper GI endoscopy was                         accomplished without difficulty. The patient tolerated  the procedure well. Findings:      Grade I varices were found in the lower third of the esophagus.      Moderate portal hypertensive gastropathy was found in the entire       examined stomach.      Patchy mildly erythematous mucosa without active bleeding and with no       stigmata of bleeding was found in the duodenal bulb. Impression:            - Grade I esophageal varices.                        - Portal hypertensive gastropathy.                        - Erythematous duodenopathy.                        - No specimens collected. Recommendation:        - Discharge patient to home.                        - Resume previous  diet.                        - Continue present medications.                        - Repeat upper endoscopy in 3 years for surveillance.                        - Return to referring physician as previously                         scheduled. Procedure Code(s):     --- Professional ---                        [phone], Esophagogastroduodenoscopy, flexible,                         transoral; diagnostic, including collection of                         specimen(s) by brushing or washing, when performed                         (separate procedure) Diagnosis Code(s):     --- Professional ---                        I85.00, Esophageal varices without bleeding                        K76.6, Portal hypertension                        K31.89, Other diseases of stomach and duodenum CPT copyright 2019 American Medical Association. All rights reserved. The codes documented in this report are preliminary and upon coder review may  be revised to meet current compliance requirements. Andrey Farmer, MD Andrey Farmer MD, MD 02/22/2020 2:45:33 PM Number of Addenda: 0 Note Initiated On: 02/22/2020 12:20 PM Estimated Blood Loss:  Estimated blood loss: none.  [facility]

## 2020-02-22 NOTE — H&P (Signed)
Outpatient short stay form Pre-procedure 02/22/2020 1:05 PM Todd Miyamoto MD, MPH  Primary Physician: Dr. Doy Hutching  Reason for visit:  Cirrhosis/variceal screening and colon cancer  History of present illness:    69 y/o gentleman with recent colonoscopy that was incomplete but sigmoid polyp that turned out to be adenocarcinoma was located. Also with imaging findings concerning for cirrhosis so here for variceal screening. No blood thinners.   No current facility-administered medications for this encounter.  Medications Prior to Admission  Medication Sig Dispense Refill Last Dose  . ALPRAZolam (XANAX) 1 MG tablet Take 1 mg by mouth 3 (three) times daily.      Marland Kitchen amLODipine (NORVASC) 5 MG tablet TAKE ONE (1) TABLET BY MOUTH EVERY DAY     . atorvastatin (LIPITOR) 10 MG tablet   10   . carvedilol (COREG) 12.5 MG tablet 12.5 mg 2 (two) times daily with a meal.   10   . cyclobenzaprine (FLEXERIL) 10 MG tablet Take 10 mg by mouth 3 (three) times daily.     Marland Kitchen erythromycin [facility]) ophthalmic ointment Use a small amount on your sutures 4 times a day for the next 2 weeks. Switch to Aquaphor ointment should allergy develop. 3.5 g 3   . folic acid (FOLVITE) 1 MG tablet Take 1 tablet (1 mg total) by mouth daily. 30 tablet 2   . gabapentin (NEURONTIN) 100 MG capsule Take 200 mg by mouth 3 (three) times daily.     . magnesium oxide (MAG-OX) 400 MG tablet Take 400 mg by mouth 2 (two) times daily.     . potassium chloride (MICRO-K) 10 MEQ CR capsule   10   . rosuvastatin (CRESTOR) 10 MG tablet Take 10 mg by mouth daily.     . traMADol (ULTRAM) 50 MG tablet Take by mouth 2 (two) times daily.     . traMADol (ULTRAM) 50 MG tablet Take 1 every 4-6 hours as needed for pain not controlled by Tylenol 6 tablet 0      Allergies  Allergen Reactions  . Atenolol Other (See Comments)    Mood altering  . Metoprolol Other (See Comments)    Mood altering  . Tramadol Other (See Comments)    Not metabolized      Past Medical History:  Diagnosis Date  . Anxiety   . Back pain with sciatica    and leg  . Hypercholesterolemia   . Hypertension     Review of systems:  Otherwise negative.    Physical Exam  Gen: Alert, oriented. Appears stated age.  HEENT: [place]/AT. PERRLA. Lungs: No respiratory distress Abd: soft, benign, no masses. BS+ Ext: No edema.     Planned procedures: Proceed with EGD/colonoscopy. The patient understands the nature of the planned procedure, indications, risks, alternatives and potential complications including but not limited to bleeding, infection, perforation, damage to internal organs and possible oversedation/side effects from anesthesia. The patient agrees and gives consent to proceed.  Please refer to procedure notes for findings, recommendations and patient disposition/instructions.     Todd Miyamoto MD, MPH Gastroenterology 02/22/2020  1:05 PM

## 2020-02-22 NOTE — Anesthesia Preprocedure Evaluation (Signed)
Anesthesia Evaluation  Patient identified by MRN, date of birth, ID band Patient awake    Reviewed: Allergy & Precautions, H&P , NPO status , Patient's Chart, lab work & pertinent test results, reviewed documented beta blocker date and time   History of Anesthesia Complications Negative for: history of anesthetic complications  Airway Mallampati: III  TM Distance: <3 FB Neck ROM: limited    Dental  (+) Upper Dentures, Lower Dentures   Pulmonary COPD, Current Smoker and Patient abstained from smoking.,    Pulmonary exam normal        Cardiovascular Exercise Tolerance: Good hypertension, Pt. on medications and Pt. on home beta blockers (-) angina(-) Past MI and (-) DOE Normal cardiovascular exam     Neuro/Psych PSYCHIATRIC DISORDERS  Neuromuscular disease    GI/Hepatic negative GI ROS, Neg liver ROS, neg GERD  ,  Endo/Other  negative endocrine ROS  Renal/GU negative Renal ROS  negative genitourinary   Musculoskeletal   Abdominal   Peds  Hematology negative hematology ROS (+)   Anesthesia Other Findings Past Medical History: No date: Anxiety No date: Back pain with sciatica     Comment:  and leg No date: Hypercholesterolemia No date: Hypertension  Past Surgical History: 12/10/2017: BROW LIFT; Bilateral     Comment:  Procedure: BLEPHAROPLASTY UPPER EYELID WITH EXCESS SKIN;              Surgeon: Karle Starch, MD;  Location: [facility];  Service: Ophthalmology;  Laterality: Bilateral;                bilateral  12/10/2017: PTOSIS REPAIR; Bilateral     Comment:  Procedure: PTOSIS REPAIR RESECT EX;  Surgeon: Karle Starch, MD;  Location: [facility];  Service:               Ophthalmology;  Laterality: Bilateral; No date: TOTAL HIP ARTHROPLASTY; Left  BMI    Body Mass Index: 27.72 kg/m      Reproductive/Obstetrics negative OB ROS                              Anesthesia Physical  Anesthesia Plan  ASA: III  Anesthesia Plan: General   Post-op Pain Management:    Induction: Intravenous  PONV Risk Score and Plan: Propofol infusion and TIVA  Airway Management Planned: Natural Airway and Nasal Cannula  Additional Equipment:   Intra-op Plan:   Post-operative Plan:   Informed Consent: I have reviewed the patients History and Physical, chart, labs and discussed the procedure including the risks, benefits and alternatives for the proposed anesthesia with the patient or authorized representative who has indicated his/her understanding and acceptance.     Dental Advisory Given  Plan Discussed with: Anesthesiologist, CRNA and Surgeon  Anesthesia Plan Comments: (Patient consented for risks of anesthesia including but not limited to:  - adverse reactions to medications - risk of intubation if required - damage to eyes, teeth, lips or other oral mucosa - nerve damage due to positioning  - sore throat or hoarseness - Damage to heart, brain, nerves, lungs, other parts of body or loss of life  Patient voiced understanding.)        Anesthesia Quick Evaluation

## 2020-02-22 NOTE — Interval H&P Note (Signed)
History and Physical Interval Note:  02/22/2020 1:08 PM  Todd Beard  has presented today for surgery, with the diagnosis of ALCOHOLIC CIRRHOSIS MALIGNANT NEOPLASM SIGMOID COLON.  The various methods of treatment have been discussed with the patient and family. After consideration of risks, benefits and other options for treatment, the patient has consented to  Procedure(s): COLONOSCOPY WITH PROPOFOL (N/A) ESOPHAGOGASTRODUODENOSCOPY (EGD) WITH PROPOFOL (N/A) as a surgical intervention.  The patient's history has been reviewed, patient examined, no change in status, stable for surgery.  I have reviewed the patient's chart and labs.  Questions were answered to the patient's satisfaction.     [name]  Ok to proceed with EGD/Colonoscopy

## 2020-02-22 NOTE — Transfer of Care (Signed)
Immediate Anesthesia Transfer of Care Note  Patient: Todd Beard  Procedure(s) Performed: COLONOSCOPY WITH PROPOFOL (N/A ) ESOPHAGOGASTRODUODENOSCOPY (EGD) WITH PROPOFOL (N/A )  Patient Location: PACU and Endoscopy Unit  Anesthesia Type:General  Level of Consciousness: drowsy  Airway & Oxygen Therapy: Patient Spontanous Breathing and Patient connected to nasal cannula oxygen  Post-op Assessment: Report given to RN and Post -op Vital signs reviewed and stable  Post vital signs: Reviewed and stable  Last Vitals:  Vitals Value Taken Time  BP 121/65 02/22/20 1459  Temp 36.7 C 02/22/20 1458  Pulse 63 02/22/20 1500  Resp 14 02/22/20 1500  SpO2 93 % 02/22/20 1500  Vitals shown include unvalidated device data.  Last Pain:  Vitals:   02/22/20 1458  TempSrc: Temporal  PainSc:          Complications: No complications documented.

## 2020-02-22 NOTE — Anesthesia Postprocedure Evaluation (Signed)
Anesthesia Post Note  Patient: Todd Beard  Procedure(s) Performed: COLONOSCOPY WITH PROPOFOL (N/A ) ESOPHAGOGASTRODUODENOSCOPY (EGD) WITH PROPOFOL (N/A )  Patient location during evaluation: PACU Anesthesia Type: General Level of consciousness: awake and alert Pain management: pain level controlled Vital Signs Assessment: post-procedure vital signs reviewed and stable Respiratory status: spontaneous breathing and respiratory function stable Cardiovascular status: stable Anesthetic complications: no   No complications documented.   Last Vitals:  Vitals:   02/22/20 1311 02/22/20 1458  BP: (!) 141/112 121/65  Pulse: 67 64  Resp: 18 14  Temp: (!) 35.9 C 36.7 C  SpO2: 95% 93%    Last Pain:  Vitals:   02/22/20 1458  TempSrc: Temporal  PainSc: Asleep                 Todd Beard

## 2020-02-22 NOTE — Anesthesia Procedure Notes (Signed)
Date/Time: 02/22/2020 1:30 PM Performed by: Johnna Acosta, CRNA Pre-anesthesia Checklist: Patient identified, Emergency Drugs available, Suction available, Timeout performed and Patient being monitored Patient Re-evaluated:Patient Re-evaluated prior to induction Oxygen Delivery Method: Nasal cannula Preoxygenation: Pre-oxygenation with 100% oxygen Induction Type: IV induction

## 2020-02-23 ENCOUNTER — Encounter: Payer: Self-pay | Admitting: Gastroenterology

## 2020-02-24 LAB — SURGICAL PATHOLOGY

## 2020-02-26 ENCOUNTER — Other Ambulatory Visit: Payer: Medicare Other

## 2020-02-29 ENCOUNTER — Ambulatory Visit: Admit: 2020-02-29 | Payer: Medicare Other

## 2020-02-29 ENCOUNTER — Other Ambulatory Visit: Payer: Self-pay

## 2020-02-29 ENCOUNTER — Ambulatory Visit
Admission: RE | Admit: 2020-02-29 | Discharge: 2020-02-29 | Disposition: A | Discharge status: Home / Self Care | Payer: Medicare Other | Source: Ambulatory Visit | Attending: Gastroenterology | Admitting: Gastroenterology

## 2020-02-29 DIAGNOSIS — C187 Malignant neoplasm of sigmoid colon: Secondary | ICD-10-CM | POA: Diagnosis not present

## 2020-02-29 SURGERY — COLONOSCOPY
Anesthesia: General

## 2020-03-01 ENCOUNTER — Ambulatory Visit: Payer: Self-pay | Admitting: Surgery

## 2020-03-01 MED ORDER — INDOCYANINE GREEN 25 MG IV SOLR
10.0000 mg | Freq: Once | INTRAVENOUS | Status: AC
  Filled 2020-03-01: qty 10

## 2020-03-01 NOTE — H&P (Signed)
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 male who was referred by Todd Beard* for evaluation of above. First noted on cscope after positive cologuard test.  Pending MRI abdomen after CT lung noted nodular liver.  Pt has long standing smoking and drinking history.  Trying to cut down.  No other specific complaints    Past Medical History:  has a past medical history of Allergic state, Hypertension, Sciatica of left side, and Varicella.  Past Surgical History:  has a past surgical history that includes Left total hip arthroplasty (11/06/2013); Colonoscopy (02/05/2020); Colonoscopy (02/22/2020); and egd (02/22/2020).  Family History: family history includes Coronary Artery Disease (Blocked arteries around heart) in his father and mother; Diabetes in his mother; High blood pressure (Hypertension) in his mother.  Social History:  reports that he has been smoking cigarettes. He has a 46.00 pack-year smoking history. He has never used smokeless tobacco. He reports current alcohol use of about 14.0 standard drinks of alcohol per week. He reports that he does not use drugs.  Current Medications: has a current medication list which includes the following prescription(s): alprazolam, amlodipine, carvedilol, cyclobenzaprine, folic acid/multivit-min/lutein, gabapentin, ibuprofen, rosuvastatin, and sodium, potassium, and magnesium.  Allergies:  Allergies  Allergen Reactions  . Atenolol Other (See Comments)    Mood altering  . Metoprolol Other (See Comments)    Mood altering  . Tramadol Other (See Comments)    Unable to metabolize, causes over medicated effect    ROS:  A 15 point review of systems was performed and pertinent positives and negatives noted in HPI   Objective:     BP 95/54   Pulse 71   Ht 157 cm (5' 1.81")   Wt 99.8 kg (220 lb)   BMI 40.49 kg/m   Constitutional :  alert, appears stated age,  cooperative and no distress  Lymphatics/Throat:  no asymmetry, masses, or scars  Respiratory:  clear to auscultation bilaterally  Cardiovascular:  regular rate and rhythm  Gastrointestinal: soft, non-tender; bowel sounds normal; no masses,  no organomegaly.    Musculoskeletal: Steady gait and movement  Skin: Cool and moist  Psychiatric: Normal affect, non-agitated, not confused       LABS:  SURGICAL PATHOLOGY  CASE: [id number]  PATIENT: Todd Beard  Surgical Pathology Report      Specimen Submitted:  A. Ileo cecal valve; cbx  B. Colon polyp, sigmoid previous bx site; cold snare  C. Colon polyp, transverse; hot snare  D. Colon polyp, descending; cold snare  E. Colon polyp x2, sigmoid; h snare and cbx   Clinical History: Alcoholic cirrhosis, malignant neoplasm sigmoid colon.  Portal hypertensive gastropathy; small esophageal varices; malignant  neoplasm of sigmoid colon; transverse colon polyp.       DIAGNOSIS:  A. ILEOCECAL VALVE; COLD BIOPSY:  - COLONIC MUCOSA WITH MILD ARCHITECTURAL DISTORTION.  - NEGATIVE FOR ACTIVE COLITIS, DYSPLASIA, AND MALIGNANCY.   B. COLON POLYP, SIGMOID PREVIOUS BIOPSY SITE; COLD SNARE:  - INVASIVE MODERATELY DIFFERENTIATED ADENOCARCINOMA.  - TUMOR IS PRESENT AT THE RESECTION MARGIN.  - SEE COMMENT.   Comment:  Container B was received labeled as "cecum". Per conversation with the  endoscopist, this biopsy was taken from the previous sigmoid biopsy  site. There was no tissue submitted from the cecum.   C. COLON POLYP, TRANSVERSE; HOT SNARE:  - TUBULAR ADENOMA.  - NEGATIVE FOR HIGH-GRADE DYSPLASIA AND MALIGNANCY.   D. COLON POLYP, DESCENDING; COLD  SNARE:  - TUBULAR ADENOMA.  - NEGATIVE FOR HIGH-GRADE DYSPLASIA AND MALIGNANCY.   E. COLON POLYP X2, SIGMOID; HOT SNARE AND COLD BIOPSY:  - COLONIC MUCOSA WITH SUPERFICIAL HYPERPLASTIC CHANGES.  - NEGATIVE FOR DYSPLASIA AND MALIGNANCY.    GROSS DESCRIPTION:  A. Labeled: CBX'S  ileocecal valve  Received: Formalin  Tissue fragment(s): 2  Size: Range from 0.3-0.4 cm  Description: Tan soft tissue fragments  Entirely submitted in 1 cassette.   B. Labeled: Cold snare cecal polyp, SEE COMMENT.  Received: Formalin  Tissue fragment(s): 2  Size: Range from 0.2-0.9 cm  Description: The specimen is comprised of a small fragment of fecal  matter and a larger fragment of tan soft tissue. The soft tissue  fragment has a resection margin which is inked black. This fragment is  bisected.  Entirely submitted in 1 cassette.   C. Labeled: Transverse colon polyp, hot snare  Received: Formalin  Tissue fragment(s): Multiple  Size: Aggregate, 1.2 x 0.9 x 0.5 cm  Description: Received are soft tissue fragments with admixed fecal  matter. The largest soft tissuefragment has a resection margin which  is inked green. This fragment is bisected. The ratio of soft tissue  fragments to fecal matter is 60:40.  Entirely submitted in 1 cassette.   D. Labeled: Cold snare descending colon polyp  Received: Formalin  Tissue fragment(s): Multiple  Size: Aggregate, 1.2 x 0.5 x 0.3 cm  Description: Received are tan soft tissue fragments admixed with fecal  matter. The ratio of soft tissue fragments to fecal matter is 70:30.  Entirely submitted in 1 cassette.   E. Labeled: Hot snare sigmoid polyp and CBX'S sigmoid polyp  Received: Formalin  Tissue fragment(s): Multiple  Size: Aggregate, 0.7 x 0.6 x 0.3 cm  Description: Received are 2 fragments of tan soft tissue ranging from  0.3 to 0.7 cm in greatest dimension. There are multiple additional  smaller fragments that are less than 0.1 cm in greatest dimension.  Entirely submitted in 1 cassette.    Final Diagnosis performed by Betsy Pries, MD.  Electronically signed  02/24/2020 1:35:56PM  The electronic signature indicates that the named Attending Pathologist  has evaluated the specimen  Technical component performed at  [facility], [address], [place],  [place] [address] Lab: [phone] Dir: Rush Farmer, MD, MMM  Professional component performed at [facility], [facility], [place], [place], [place] [address] Lab: [phone]  Dir: Dellia Nims. Rubinas, MD CEA- pending  RADS: CLINICAL DATA: Malignant neoplasm of sigmoid colon   EXAM:  CT CHEST WITHOUT CONTRAST   TECHNIQUE:  Multidetector CT imaging of the chest was performed following the  standard protocol without IV contrast.   COMPARISON: 2016   FINDINGS:  Cardiovascular: Aorta is normal in caliber with calcified plaque.  Diffuse coronary artery calcification is present. Heart size is  normal. No pericardial effusion.   Mediastinum/Nodes: There are no enlarged lymph nodes identified on  this noncontrast study. The thyroid is unremarkable.   Lungs/Pleura: Central airways are patent. There are some dependent  secretions within the trachea. No consolidation or mass. Scarring  adjacent to right rib fractures. No pleural effusion.   Upper Abdomen: Cholelithiasis. Enlarged liver with new surface  nodularity. Left adrenal nodule on the prior study is not as well  seen but likely unchanged.   Musculoskeletal: Chronic bilateral rib fractures. Chronic fracture  of the sternum. No acute osseous abnormality.   IMPRESSION:  No evidence of metastatic disease in the chest.  Enlarged liver with new surface nodularity suggesting cirrhosis.    Electronically Signed  By: Macy Mis M.D.  On: 02/29/2020 10:18  Other Result Text  Macy Mis, MD - 02/29/2020  Formatting of this note might be different from the original.  CLINICAL DATA: Malignant neoplasm of sigmoid colon   EXAM:  CT CHEST WITHOUT CONTRAST   TECHNIQUE:  Multidetector CT imaging of the chest was performed following the  standard protocol without IV contrast.   COMPARISON: 2016   FINDINGS:  Cardiovascular: Aorta is normal in caliber  with calcified plaque.  Diffuse coronary artery calcification is present. Heart size is  normal. No pericardial effusion.   Mediastinum/Nodes: There are no enlarged lymph nodes identified on  this noncontrast study. The thyroid is unremarkable.   Lungs/Pleura: Central airways are patent. There are some dependent  secretions within the trachea. No consolidation or mass. Scarring  adjacent to right rib fractures. No pleural effusion.   Upper Abdomen: Cholelithiasis. Enlarged liver with new surface  nodularity. Left adrenal nodule on the prior study is not as well  seen but likely unchanged.   Musculoskeletal: Chronic bilateral rib fractures. Chronic fracture  of the sternum. No acute osseous abnormality.   IMPRESSION:  No evidence of metastatic disease in the chest.   Enlarged liver with new surface nodularity suggesting cirrhosis.    Electronically Signed   By: Macy Mis M.D.   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  The risk of laparoscopic colon resection surgery includes, but not limited to, recurrence, bleeding, chronic pain, post-op infxn, post-op SBO or ileus, hernias, resection of bowel, re-anastamosis, possible ostomy placement and need for re-operation to address said risks. The risks of general anesthetic, if used, includes MI, CVA, sudden death or even reaction to anesthetic medications also discussed. Alternatives include continued observation.  Benefits include possible symptom relief, preventing further decline in health and possible death.   Typical post-op recovery time of additional days in hospital for observation afterwards also discussed.   Prep ordered.  Will proceed with ERAS protocol as well.  Pending medical clearance due to multiple comorbidities  above.  I discussed very high perioperative risk due to his comorbidities, and how we can minimize risk if he starts decreasing alcohol intake, and stop smoking immediately.     The patient and family members at bedside verbalized understanding and all questions were answered to the patient's satisfaction.  Will still proceed with surgery due to limited options at this point, pending negative MRI.  Will also discuss with GI re: obtaining CEA levels prior to surgery.

## 2020-03-01 NOTE — H&P (View-Only) (Signed)
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 male who was referred by Hassie Bruce* for evaluation of above. First noted on cscope after positive cologuard test.  Pending MRI abdomen after CT lung noted nodular liver.  Pt has long standing smoking and drinking history.  Trying to cut down.  No other specific complaints    Past Medical History:  has a past medical history of Allergic state, Hypertension, Sciatica of left side, and Varicella.  Past Surgical History:  has a past surgical history that includes Left total hip arthroplasty (11/06/2013); Colonoscopy (02/05/2020); Colonoscopy (02/22/2020); and egd (02/22/2020).  Family History: family history includes Coronary Artery Disease (Blocked arteries around heart) in his father and mother; Diabetes in his mother; High blood pressure (Hypertension) in his mother.  Social History:  reports that he has been smoking cigarettes. He has a 46.00 pack-year smoking history. He has never used smokeless tobacco. He reports current alcohol use of about 14.0 standard drinks of alcohol per week. He reports that he does not use drugs.  Current Medications: has a current medication list which includes the following prescription(s): alprazolam, amlodipine, carvedilol, cyclobenzaprine, folic acid/multivit-min/lutein, gabapentin, ibuprofen, rosuvastatin, and sodium, potassium, and magnesium.  Allergies:  Allergies  Allergen Reactions  . Atenolol Other (See Comments)    Mood altering  . Metoprolol Other (See Comments)    Mood altering  . Tramadol Other (See Comments)    Unable to metabolize, causes over medicated effect    ROS:  A 15 point review of systems was performed and pertinent positives and negatives noted in HPI   Objective:     BP 95/54   Pulse 71   Ht 157 cm (5' 1.81")   Wt 99.8 kg (220 lb)   BMI 40.49 kg/m   Constitutional :  alert, appears stated age,  cooperative and no distress  Lymphatics/Throat:  no asymmetry, masses, or scars  Respiratory:  clear to auscultation bilaterally  Cardiovascular:  regular rate and rhythm  Gastrointestinal: soft, non-tender; bowel sounds normal; no masses,  no organomegaly.    Musculoskeletal: Steady gait and movement  Skin: Cool and moist  Psychiatric: Normal affect, non-agitated, not confused       LABS:  SURGICAL PATHOLOGY  CASE: [phone]  PATIENT: Todd Beard  Surgical Pathology Report      Specimen Submitted:  A. Ileo cecal valve; cbx  B. Colon polyp, sigmoid previous bx site; cold snare  C. Colon polyp, transverse; hot snare  D. Colon polyp, descending; cold snare  E. Colon polyp x2, sigmoid; h snare and cbx   Clinical History: Alcoholic cirrhosis, malignant neoplasm sigmoid colon.  Portal hypertensive gastropathy; small esophageal varices; malignant  neoplasm of sigmoid colon; transverse colon polyp.       DIAGNOSIS:  A. ILEOCECAL VALVE; COLD BIOPSY:  - COLONIC MUCOSA WITH MILD ARCHITECTURAL DISTORTION.  - NEGATIVE FOR ACTIVE COLITIS, DYSPLASIA, AND MALIGNANCY.   B. COLON POLYP, SIGMOID PREVIOUS BIOPSY SITE; COLD SNARE:  - INVASIVE MODERATELY DIFFERENTIATED ADENOCARCINOMA.  - TUMOR IS PRESENT AT THE RESECTION MARGIN.  - SEE COMMENT.   Comment:  Container B was received labeled as "cecum". Per conversation with the  endoscopist, this biopsy was taken from the previous sigmoid biopsy  site. There was no tissue submitted from the cecum.   C. COLON POLYP, TRANSVERSE; HOT SNARE:  - TUBULAR ADENOMA.  - NEGATIVE FOR HIGH-GRADE DYSPLASIA AND MALIGNANCY.   D. COLON POLYP, DESCENDING; COLD  SNARE:  - TUBULAR ADENOMA.  - NEGATIVE FOR HIGH-GRADE DYSPLASIA AND MALIGNANCY.   E. COLON POLYP X2, SIGMOID; HOT SNARE AND COLD BIOPSY:  - COLONIC MUCOSA WITH SUPERFICIAL HYPERPLASTIC CHANGES.  - NEGATIVE FOR DYSPLASIA AND MALIGNANCY.    GROSS DESCRIPTION:  A. Labeled: CBX'S  ileocecal valve  Received: Formalin  Tissue fragment(s): 2  Size: Range from 0.3-0.4 cm  Description: Tan soft tissue fragments  Entirely submitted in 1 cassette.   B. Labeled: Cold snare cecal polyp, SEE COMMENT.  Received: Formalin  Tissue fragment(s): 2  Size: Range from 0.2-0.9 cm  Description: The specimen is comprised of a small fragment of fecal  matter and a larger fragment of tan soft tissue. The soft tissue  fragment has a resection margin which is inked black. This fragment is  bisected.  Entirely submitted in 1 cassette.   C. Labeled: Transverse colon polyp, hot snare  Received: Formalin  Tissue fragment(s): Multiple  Size: Aggregate, 1.2 x 0.9 x 0.5 cm  Description: Received are soft tissue fragments with admixed fecal  matter. The largest soft tissuefragment has a resection margin which  is inked green. This fragment is bisected. The ratio of soft tissue  fragments to fecal matter is 60:40.  Entirely submitted in 1 cassette.   D. Labeled: Cold snare descending colon polyp  Received: Formalin  Tissue fragment(s): Multiple  Size: Aggregate, 1.2 x 0.5 x 0.3 cm  Description: Received are tan soft tissue fragments admixed with fecal  matter. The ratio of soft tissue fragments to fecal matter is 70:30.  Entirely submitted in 1 cassette.   E. Labeled: Hot snare sigmoid polyp and CBX'S sigmoid polyp  Received: Formalin  Tissue fragment(s): Multiple  Size: Aggregate, 0.7 x 0.6 x 0.3 cm  Description: Received are 2 fragments of tan soft tissue ranging from  0.3 to 0.7 cm in greatest dimension. There are multiple additional  smaller fragments that are less than 0.1 cm in greatest dimension.  Entirely submitted in 1 cassette.    Final Diagnosis performed by Betsy Pries, MD.  Electronically signed  02/24/2020 1:35:56PM  The electronic signature indicates that the named Attending Pathologist  has evaluated the specimen  Technical component performed at  [facility], [address], [place],  [place] [address] Lab: [phone] Dir: Rush Farmer, MD, MMM  Professional component performed at [facility], [facility], [place], [place], [place] [address] Lab: [phone]  Dir: Dellia Nims. Rubinas, MD CEA- pending  RADS: CLINICAL DATA: Malignant neoplasm of sigmoid colon   EXAM:  CT CHEST WITHOUT CONTRAST   TECHNIQUE:  Multidetector CT imaging of the chest was performed following the  standard protocol without IV contrast.   COMPARISON: 2016   FINDINGS:  Cardiovascular: Aorta is normal in caliber with calcified plaque.  Diffuse coronary artery calcification is present. Heart size is  normal. No pericardial effusion.   Mediastinum/Nodes: There are no enlarged lymph nodes identified on  this noncontrast study. The thyroid is unremarkable.   Lungs/Pleura: Central airways are patent. There are some dependent  secretions within the trachea. No consolidation or mass. Scarring  adjacent to right rib fractures. No pleural effusion.   Upper Abdomen: Cholelithiasis. Enlarged liver with new surface  nodularity. Left adrenal nodule on the prior study is not as well  seen but likely unchanged.   Musculoskeletal: Chronic bilateral rib fractures. Chronic fracture  of the sternum. No acute osseous abnormality.   IMPRESSION:  No evidence of metastatic disease in the chest.  Enlarged liver with new surface nodularity suggesting cirrhosis.    Electronically Signed  By: Macy Mis M.D.  On: 02/29/2020 10:18  Other Result Text  Macy Mis, MD - 02/29/2020  Formatting of this note might be different from the original.  CLINICAL DATA: Malignant neoplasm of sigmoid colon   EXAM:  CT CHEST WITHOUT CONTRAST   TECHNIQUE:  Multidetector CT imaging of the chest was performed following the  standard protocol without IV contrast.   COMPARISON: 2016   FINDINGS:  Cardiovascular: Aorta is normal in caliber  with calcified plaque.  Diffuse coronary artery calcification is present. Heart size is  normal. No pericardial effusion.   Mediastinum/Nodes: There are no enlarged lymph nodes identified on  this noncontrast study. The thyroid is unremarkable.   Lungs/Pleura: Central airways are patent. There are some dependent  secretions within the trachea. No consolidation or mass. Scarring  adjacent to right rib fractures. No pleural effusion.   Upper Abdomen: Cholelithiasis. Enlarged liver with new surface  nodularity. Left adrenal nodule on the prior study is not as well  seen but likely unchanged.   Musculoskeletal: Chronic bilateral rib fractures. Chronic fracture  of the sternum. No acute osseous abnormality.   IMPRESSION:  No evidence of metastatic disease in the chest.   Enlarged liver with new surface nodularity suggesting cirrhosis.    Electronically Signed   By: Macy Mis M.D.   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  The risk of laparoscopic colon resection surgery includes, but not limited to, recurrence, bleeding, chronic pain, post-op infxn, post-op SBO or ileus, hernias, resection of bowel, re-anastamosis, possible ostomy placement and need for re-operation to address said risks. The risks of general anesthetic, if used, includes MI, CVA, sudden death or even reaction to anesthetic medications also discussed. Alternatives include continued observation.  Benefits include possible symptom relief, preventing further decline in health and possible death.   Typical post-op recovery time of additional days in hospital for observation afterwards also discussed.   Prep ordered.  Will proceed with ERAS protocol as well.  Pending medical clearance due to multiple comorbidities  above.  I discussed very high perioperative risk due to his comorbidities, and how we can minimize risk if he starts decreasing alcohol intake, and stop smoking immediately.     The patient and family members at bedside verbalized understanding and all questions were answered to the patient's satisfaction.  Will still proceed with surgery due to limited options at this point, pending negative MRI.  Will also discuss with GI re: obtaining CEA levels prior to surgery.

## 2020-03-02 ENCOUNTER — Ambulatory Visit: Payer: Medicare Other

## 2020-03-04 ENCOUNTER — Other Ambulatory Visit: Payer: Self-pay

## 2020-03-04 ENCOUNTER — Ambulatory Visit
Admission: RE | Admit: 2020-03-04 | Discharge: 2020-03-04 | Disposition: A | Discharge status: Home / Self Care | Payer: Medicare Other | Source: Ambulatory Visit | Attending: Gastroenterology | Admitting: Gastroenterology

## 2020-03-04 DIAGNOSIS — C187 Malignant neoplasm of sigmoid colon: Secondary | ICD-10-CM | POA: Diagnosis present

## 2020-03-04 MED ORDER — GADOBUTROL 1 MMOL/ML IV SOLN
8.0000 mL | Freq: Once | INTRAVENOUS | Status: AC | PRN
  Administered 2020-03-04: 8 mL via INTRAVENOUS

## 2020-03-11 ENCOUNTER — Encounter
Admission: RE | Admit: 2020-03-11 | Discharge: 2020-03-11 | Disposition: A | Discharge status: Home / Self Care | Payer: Medicare Other | Source: Ambulatory Visit | Attending: Surgery | Admitting: Surgery

## 2020-03-11 ENCOUNTER — Other Ambulatory Visit: Payer: Self-pay

## 2020-03-11 DIAGNOSIS — Z01818 Encounter for other preprocedural examination: Secondary | ICD-10-CM | POA: Diagnosis present

## 2020-03-11 HISTORY — DX: Other intervertebral disc degeneration, lumbar region without mention of lumbar back pain or lower extremity pain: M51.369

## 2020-03-11 HISTORY — DX: Hypomagnesemia: E83.42

## 2020-03-11 HISTORY — DX: Iron deficiency: E61.1

## 2020-03-11 HISTORY — DX: Inflammatory liver disease, unspecified: K75.9

## 2020-03-11 HISTORY — DX: Other complications of anesthesia, initial encounter: T88.59XA

## 2020-03-11 HISTORY — DX: Other intervertebral disc degeneration, lumbar region: M51.36

## 2020-03-11 LAB — COMPREHENSIVE METABOLIC PANEL
ALT: 25 U/L (ref 0–44)
AST: 46 U/L — ABNORMAL HIGH (ref 15–41)
Albumin: 3 g/dL — ABNORMAL LOW (ref 3.5–5.0)
Alkaline Phosphatase: 160 U/L — ABNORMAL HIGH (ref 38–126)
Anion gap: 15 (ref 5–15)
BUN: 19 mg/dL (ref 8–23)
CO2: 19 mmol/L — ABNORMAL LOW (ref 22–32)
Calcium: 8.1 mg/dL — ABNORMAL LOW (ref 8.9–10.3)
Chloride: 104 mmol/L (ref 98–111)
Creatinine, Ser: 2.36 mg/dL — ABNORMAL HIGH (ref 0.61–1.24)
GFR calc Af Amer: 32 mL/min — ABNORMAL LOW (ref >60)
GFR calc non Af Amer: 27 mL/min — ABNORMAL LOW (ref >60)
Glucose, Bld: 117 mg/dL — ABNORMAL HIGH (ref 70–99)
Potassium: 3 mmol/L — ABNORMAL LOW (ref 3.5–5.1)
Sodium: 138 mmol/L (ref 135–145)
Total Bilirubin: 1.5 mg/dL — ABNORMAL HIGH (ref 0.3–1.2)
Total Protein: 7.7 g/dL (ref 6.5–8.1)

## 2020-03-11 LAB — TYPE AND SCREEN
ABO/RH(D): O POS
Antibody Screen: NEGATIVE

## 2020-03-11 LAB — CBC WITH DIFFERENTIAL/PLATELET
Abs Immature Granulocytes: 0.01 10*3/uL (ref 0.00–0.07)
Basophils Absolute: 0.1 10*3/uL (ref 0.0–0.1)
Basophils Relative: 1 %
Eosinophils Absolute: 0.1 10*3/uL (ref 0.0–0.5)
Eosinophils Relative: 1 %
HCT: 41.1 % (ref 39.0–52.0)
Hemoglobin: 13.9 g/dL (ref 13.0–17.0)
Immature Granulocytes: 0 %
Lymphocytes Relative: 27 %
Lymphs Abs: 1.5 10*3/uL (ref 0.7–4.0)
MCH: 34 pg (ref 26.0–34.0)
MCHC: 33.8 g/dL (ref 30.0–36.0)
MCV: 100.5 fL — ABNORMAL HIGH (ref 80.0–100.0)
Monocytes Absolute: 0.9 10*3/uL (ref 0.1–1.0)
Monocytes Relative: 16 %
Neutro Abs: 3.1 10*3/uL (ref 1.7–7.7)
Neutrophils Relative %: 55 %
Platelets: 143 10*3/uL — ABNORMAL LOW (ref 150–400)
RBC: 4.09 MIL/uL — ABNORMAL LOW (ref 4.22–5.81)
RDW: 15.9 % — ABNORMAL HIGH (ref 11.5–15.5)
WBC: 5.7 10*3/uL (ref 4.0–10.5)
nRBC: 0 % (ref 0.0–0.2)

## 2020-03-11 LAB — PROTIME-INR
INR: 1.2 (ref 0.8–1.2)
Prothrombin Time: 14.6 seconds (ref 11.4–15.2)

## 2020-03-11 NOTE — Consult Note (Signed)
[facility] Nurse requested for preoperative stoma site marking  Discussed surgical procedure and stoma creation with patient. Explained role of the [place] nurse team.  Provided the patient with educational booklet and provided samples of pouching options.  Answered patient questions.   Examined patient lying, sitting, and standing in order to place the marking in the patient's visual field, away from any creases or abdominal contour issues and within the rectus muscle. Patient states he is "not wearing a bag.  I won't allow it"  We discussed various reasons why a bag would be indicated and he becomes less emotional and agrees he wants life saving interventions if needed. I explain the importance of presurgical marking in various positions, within the rectus muscle.  He wears his pants below his umbilicus, therefore I have marked him above the belt line.    Marked for colostomy in the LLQ  4 cm to the left of the umbilicus and 3 cm below the umbilicus.  Marked for ileostomy in the RLQ 4 cm to the right of the umbilicus and  3 cm below the umbilicus.    Patient's abdomen cleansed with CHG wipes at site markings, allowed to air dry prior to marking.Covered mark with thin film transparent dressing to preserve mark until date of surgery.   [facility] Nurse team will follow up with patient after surgery for continue ostomy care and teaching.   Domenic Moras MSN, RN, FNP-BC [facility] Wound, Ostomy, Continence Nurse Pager [phone]

## 2020-03-11 NOTE — Progress Notes (Addendum)
  [facility] Perioperative Services: Pre-Admission/Anesthesia Testing  Abnormal Lab Notification    Date: 03/11/20  Name: Todd Beard MRN:   [id number]  Re: Abnormal labs noted during PAT appointment   Provider(s) Notified: Benjamine Sprague, DO Notification mode: Routed and/or faxed via [facility] LAB VALUE(S): Lab Results  Component Value Date   K 3.0 (L) 03/11/2020   CREATININE 2.36 (H) 03/11/2020   CALCIUM 8.1 (L) 03/11/2020   ALKPHOS 160 (H) 03/11/2020   BILITOT 1.5 (H) 03/11/2020     Notes: Scheduled for robot assisted sigmoid colectomy on 03/18/2020. Will send to surgeon for review and optimization.  This is a Community education officer; no formal response is required.  Honor Loh, MSN, APRN, FNP-C, CEN [facility]  Peri-operative Services Nurse Practitioner Phone: [phone] 03/11/20 2:33 PM

## 2020-03-11 NOTE — Patient Instructions (Addendum)
Your procedure is scheduled on: Friday March 18, 2020. Report to Day Surgery. To find out your arrival time please call [phone] between 1PM - 3PM on Thursday March 17, 2020.  Remember: Instructions that are not followed completely may result in serious medical risk,  up to and including death, or upon the discretion of your surgeon and anesthesiologist your  surgery may need to be rescheduled.     _X__ 1. Do not eat food after midnight the night before your procedure.                 No chewing gum or hard candies. You may drink clear liquids up to 2 hours                 before you are scheduled to arrive for your surgery- DO not drink clear                 liquids within 2 hours of the start of your surgery.                 Clear Liquids include:  water, apple juice without pulp, clear Gatorade, G2 or                  Gatorade Zero (avoid Red/Purple/Blue), Black Coffee or Tea (Do not add                 anything to coffee or tea).  __X__2.  On the morning of surgery brush your teeth with toothpaste and water, you                may rinse your mouth with mouthwash if you wish.  Do not swallow any toothpaste of mouthwash.     _X__ 3.  No Alcohol for 24 hours before or after surgery.   _X__ 4.  Do Not Smoke or use e-cigarettes For 24 Hours Prior to Your Surgery.                 Do not use any chewable tobacco products for at least 6 hours prior to                 Surgery.  _X__  5.  Do not use any recreational drugs (marijuana, cocaine, heroin, ecstasy, MDMA or other)                For at least one week prior to your surgery.  Combination of these drugs with anesthesia                May have life threatening results.  __X__6.  Notify your doctor if there is any change in your medical condition      (cold, fever, infections).     Do not wear jewelry, make-up, hairpins, clips or nail polish. Do not wear lotions, powders, or perfumes. You may wear  deodorant. Do not shave 48 hours prior to surgery. Men may shave face and neck. Do not bring valuables to the hospital.    [facility] is not responsible for any belongings or valuables.  Contacts, dentures or bridgework may not be worn into surgery. Leave your suitcase in the car. After surgery it may be brought to your room. For patients admitted to the hospital, discharge time is determined by your treatment team.   Patients discharged the day of surgery will not be allowed to drive home.   Make arrangements for someone to be with you for the first 24  hours of your Same Day Discharge.   __X__ Take these medicines the morning of surgery with A SIP OF WATER:    1.amLODipine (NORVASC) 5 MG   2. ALPRAZolam (XANAX) 1 MG   3.carvedilol (COREG) 12.5 MG    4.gabapentin (NEURONTIN) 100 MG  __X__ Use CHG Soap as directed  __X__ Stop Anti-inflammatories such as Ibuprofen, Aleve, Advil, naproxen, aspirin and or BC powders.    __X__ Stop supplements until after surgery.    __X__ Do not start any herbal supplements before your surgery.    If you have any questions regarding your pre-procedure instructions,  Please call Pre-admit Testing at [phone].

## 2020-03-16 ENCOUNTER — Other Ambulatory Visit: Payer: Self-pay

## 2020-03-16 ENCOUNTER — Other Ambulatory Visit
Admission: RE | Admit: 2020-03-16 | Discharge: 2020-03-16 | Disposition: A | Discharge status: Home / Self Care | Payer: Medicare Other | Source: Ambulatory Visit | Attending: Surgery | Admitting: Surgery

## 2020-03-16 DIAGNOSIS — Z20822 Contact with and (suspected) exposure to covid-19: Secondary | ICD-10-CM | POA: Insufficient documentation

## 2020-03-16 DIAGNOSIS — Z01812 Encounter for preprocedural laboratory examination: Secondary | ICD-10-CM | POA: Insufficient documentation

## 2020-03-16 LAB — SARS CORONAVIRUS 2 (TAT 6-24 HRS): SARS Coronavirus 2: NEGATIVE

## 2020-03-17 MED ORDER — SODIUM CHLORIDE 0.9 % IV SOLN
2.0000 g | INTRAVENOUS | Status: AC
  Administered 2020-03-18: 2 g via INTRAVENOUS

## 2020-03-18 ENCOUNTER — Encounter
Admission: RE | Disposition: A | Discharge status: Home / Self Care | Payer: Self-pay | Source: Home / Self Care | Attending: Surgery

## 2020-03-18 ENCOUNTER — Other Ambulatory Visit: Payer: Self-pay

## 2020-03-18 ENCOUNTER — Inpatient Hospital Stay
Admission: RE | Admit: 2020-03-18 | Discharge: 2020-03-21 | DRG: 331 | Disposition: A | Discharge status: Home / Self Care | Payer: Medicare Other | Attending: Surgery | Admitting: Surgery

## 2020-03-18 ENCOUNTER — Inpatient Hospital Stay: Payer: Medicare Other | Admitting: Urgent Care

## 2020-03-18 ENCOUNTER — Encounter: Payer: Self-pay | Admitting: Surgery

## 2020-03-18 DIAGNOSIS — M5432 Sciatica, left side: Secondary | ICD-10-CM | POA: Diagnosis present

## 2020-03-18 DIAGNOSIS — Z20822 Contact with and (suspected) exposure to covid-19: Secondary | ICD-10-CM | POA: Diagnosis present

## 2020-03-18 DIAGNOSIS — C187 Malignant neoplasm of sigmoid colon: Secondary | ICD-10-CM | POA: Diagnosis present

## 2020-03-18 DIAGNOSIS — Z8249 Family history of ischemic heart disease and other diseases of the circulatory system: Secondary | ICD-10-CM

## 2020-03-18 DIAGNOSIS — Z888 Allergy status to other drugs, medicaments and biological substances status: Secondary | ICD-10-CM

## 2020-03-18 DIAGNOSIS — Z96642 Presence of left artificial hip joint: Secondary | ICD-10-CM | POA: Diagnosis present

## 2020-03-18 DIAGNOSIS — C189 Malignant neoplasm of colon, unspecified: Secondary | ICD-10-CM

## 2020-03-18 DIAGNOSIS — Z885 Allergy status to narcotic agent status: Secondary | ICD-10-CM | POA: Diagnosis not present

## 2020-03-18 DIAGNOSIS — F17210 Nicotine dependence, cigarettes, uncomplicated: Secondary | ICD-10-CM | POA: Diagnosis present

## 2020-03-18 DIAGNOSIS — Z833 Family history of diabetes mellitus: Secondary | ICD-10-CM

## 2020-03-18 DIAGNOSIS — I10 Essential (primary) hypertension: Secondary | ICD-10-CM | POA: Diagnosis present

## 2020-03-18 HISTORY — DX: Malignant neoplasm of colon, unspecified: C18.9

## 2020-03-18 LAB — URINE DRUG SCREEN, QUALITATIVE (ARMC ONLY)
Amphetamines, Ur Screen: NOT DETECTED
Barbiturates, Ur Screen: NOT DETECTED
Benzodiazepine, Ur Scrn: POSITIVE — AB
Cannabinoid 50 Ng, Ur ~~LOC~~: NOT DETECTED
Cocaine Metabolite,Ur ~~LOC~~: NOT DETECTED
MDMA (Ecstasy)Ur Screen: NOT DETECTED
Methadone Scn, Ur: NOT DETECTED
Opiate, Ur Screen: NOT DETECTED
Phencyclidine (PCP) Ur S: NOT DETECTED
Tricyclic, Ur Screen: POSITIVE — AB

## 2020-03-18 LAB — CBC
HCT: 37.8 % — ABNORMAL LOW (ref 39.0–52.0)
Hemoglobin: 12.8 g/dL — ABNORMAL LOW (ref 13.0–17.0)
MCH: 34.6 pg — ABNORMAL HIGH (ref 26.0–34.0)
MCHC: 33.9 g/dL (ref 30.0–36.0)
MCV: 102.2 fL — ABNORMAL HIGH (ref 80.0–100.0)
Platelets: 110 10*3/uL — ABNORMAL LOW (ref 150–400)
RBC: 3.7 MIL/uL — ABNORMAL LOW (ref 4.22–5.81)
RDW: 15.3 % (ref 11.5–15.5)
WBC: 5 10*3/uL (ref 4.0–10.5)
nRBC: 0 % (ref 0.0–0.2)

## 2020-03-18 LAB — CREATININE, SERUM
Creatinine, Ser: 1.69 mg/dL — ABNORMAL HIGH (ref 0.61–1.24)
GFR calc Af Amer: 48 mL/min — ABNORMAL LOW (ref >60)
GFR calc non Af Amer: 41 mL/min — ABNORMAL LOW (ref >60)

## 2020-03-18 LAB — ABO/RH: ABO/RH(D): O POS

## 2020-03-18 LAB — PHOSPHORUS: Phosphorus: 3.1 mg/dL (ref 2.5–4.6)

## 2020-03-18 LAB — MAGNESIUM: Magnesium: 1.2 mg/dL — ABNORMAL LOW (ref 1.7–2.4)

## 2020-03-18 SURGERY — COLECTOMY, SIGMOID, ROBOT-ASSISTED
Anesthesia: General | Site: Abdomen

## 2020-03-18 MED ORDER — CELECOXIB 200 MG PO CAPS: 200.0000 mg | ORAL_CAPSULE | ORAL | Status: AC

## 2020-03-18 MED ORDER — FAMOTIDINE 20 MG PO TABS: 20.0000 mg | ORAL_TABLET | Freq: Once | ORAL | Status: AC

## 2020-03-18 MED ORDER — SUGAMMADEX SODIUM 200 MG/2ML IV SOLN
INTRAVENOUS | Status: DC | PRN
  Administered 2020-03-18: 360 mg via INTRAVENOUS

## 2020-03-18 MED ORDER — BUPIVACAINE HCL (PF) 0.5 % IJ SOLN
INTRAMUSCULAR | Status: AC
  Filled 2020-03-18: qty 30

## 2020-03-18 MED ORDER — SODIUM CHLORIDE 0.9 % IV SOLN
INTRAVENOUS | Status: AC
  Filled 2020-03-18: qty 2

## 2020-03-18 MED ORDER — LACTATED RINGERS IV SOLN: INTRAVENOUS | Status: DC

## 2020-03-18 MED ORDER — VISTASEAL 10 ML SINGLE DOSE KIT
PACK | CUTANEOUS | Status: AC
  Filled 2020-03-18: qty 10

## 2020-03-18 MED ORDER — LORAZEPAM 2 MG/ML IJ SOLN: 1.0000 mg | INTRAMUSCULAR | Status: DC | PRN

## 2020-03-18 MED ORDER — ALBUMIN HUMAN 5 % IV SOLN
INTRAVENOUS | Status: AC
  Filled 2020-03-18: qty 500

## 2020-03-18 MED ORDER — ORAL CARE MOUTH RINSE: 15.0000 mL | Freq: Once | OROMUCOSAL | Status: AC

## 2020-03-18 MED ORDER — SODIUM CHLORIDE (PF) 0.9 % IJ SOLN
INTRAMUSCULAR | Status: AC
  Filled 2020-03-18: qty 50

## 2020-03-18 MED ORDER — SPY AGENT GREEN - (INDOCYANINE FOR INJECTION)
INTRAMUSCULAR | Status: DC | PRN
  Administered 2020-03-18 (×2): 5 mg via INTRAVENOUS

## 2020-03-18 MED ORDER — ALBUTEROL SULFATE HFA 108 (90 BASE) MCG/ACT IN AERS
INHALATION_SPRAY | RESPIRATORY_TRACT | Status: DC | PRN
  Administered 2020-03-18: 6 via RESPIRATORY_TRACT

## 2020-03-18 MED ORDER — PROPOFOL 10 MG/ML IV BOLUS
INTRAVENOUS | Status: DC | PRN
  Administered 2020-03-18: 100 mg via INTRAVENOUS
  Administered 2020-03-18: 40 mg via INTRAVENOUS

## 2020-03-18 MED ORDER — OXYCODONE HCL 5 MG/5ML PO SOLN: 5.0000 mg | Freq: Once | ORAL | Status: DC | PRN

## 2020-03-18 MED ORDER — LORAZEPAM 1 MG PO TABS
1.0000 mg | ORAL_TABLET | ORAL | Status: DC | PRN
  Administered 2020-03-18: 1 mg via ORAL
  Administered 2020-03-19: 2 mg via ORAL
  Filled 2020-03-18 (×2): qty 2

## 2020-03-18 MED ORDER — FAMOTIDINE 20 MG PO TABS
ORAL_TABLET | ORAL | Status: AC
  Administered 2020-03-18: 20 mg via ORAL
  Filled 2020-03-18: qty 1

## 2020-03-18 MED ORDER — ONDANSETRON HCL 4 MG/2ML IJ SOLN: 4.0000 mg | Freq: Four times a day (QID) | INTRAMUSCULAR | Status: DC | PRN

## 2020-03-18 MED ORDER — PROPOFOL 500 MG/50ML IV EMUL
INTRAVENOUS | Status: AC
  Filled 2020-03-18: qty 50

## 2020-03-18 MED ORDER — THIAMINE HCL 100 MG/ML IJ SOLN: 100.0000 mg | Freq: Every day | INTRAMUSCULAR | Status: DC

## 2020-03-18 MED ORDER — SODIUM CHLORIDE 0.9 % IV SOLN
INTRAVENOUS | Status: DC | PRN
  Administered 2020-03-18: 70 mL

## 2020-03-18 MED ORDER — SEVOFLURANE IN SOLN
RESPIRATORY_TRACT | Status: AC
  Filled 2020-03-18: qty 250

## 2020-03-18 MED ORDER — LACTATED RINGERS IV SOLN: INTRAVENOUS | Status: DC | PRN

## 2020-03-18 MED ORDER — MORPHINE SULFATE (PF) 2 MG/ML IV SOLN: 1.0000 mg | INTRAVENOUS | Status: DC | PRN

## 2020-03-18 MED ORDER — BUPIVACAINE HCL (PF) 0.5 % IJ SOLN
INTRAMUSCULAR | Status: DC | PRN
  Administered 2020-03-18 (×2): 15 mL

## 2020-03-18 MED ORDER — CELECOXIB 200 MG PO CAPS
ORAL_CAPSULE | ORAL | Status: AC
  Administered 2020-03-18: 200 mg via ORAL
  Filled 2020-03-18: qty 1

## 2020-03-18 MED ORDER — VISTASEAL 10 ML SINGLE DOSE KIT
PACK | CUTANEOUS | Status: DC | PRN
  Administered 2020-03-18: 10 mL via TOPICAL

## 2020-03-18 MED ORDER — BUPIVACAINE LIPOSOME 1.3 % IJ SUSP
INTRAMUSCULAR | Status: AC
  Filled 2020-03-18: qty 20

## 2020-03-18 MED ORDER — ACETAMINOPHEN 500 MG PO TABS: 1000.0000 mg | ORAL_TABLET | ORAL | Status: AC

## 2020-03-18 MED ORDER — FENTANYL CITRATE (PF) 100 MCG/2ML IJ SOLN
INTRAMUSCULAR | Status: DC | PRN
Start: 2020-03-18 — End: 2020-03-18
  Administered 2020-03-18: 100 ug via INTRAVENOUS

## 2020-03-18 MED ORDER — FENTANYL CITRATE (PF) 100 MCG/2ML IJ SOLN: 25.0000 ug | INTRAMUSCULAR | Status: DC | PRN

## 2020-03-18 MED ORDER — ALBUMIN HUMAN 25 % IV SOLN: INTRAVENOUS | Status: DC | PRN

## 2020-03-18 MED ORDER — ONDANSETRON 4 MG PO TBDP: 4.0000 mg | ORAL_TABLET | Freq: Four times a day (QID) | ORAL | Status: DC | PRN

## 2020-03-18 MED ORDER — OXYCODONE HCL 5 MG PO TABS: 5.0000 mg | ORAL_TABLET | Freq: Once | ORAL | Status: DC | PRN

## 2020-03-18 MED ORDER — ONDANSETRON HCL 4 MG/2ML IJ SOLN
INTRAMUSCULAR | Status: DC | PRN
  Administered 2020-03-18: 4 mg via INTRAVENOUS

## 2020-03-18 MED ORDER — CHLORHEXIDINE GLUCONATE 0.12 % MT SOLN
OROMUCOSAL | Status: AC
  Administered 2020-03-18: 15 mL via OROMUCOSAL
  Filled 2020-03-18: qty 15

## 2020-03-18 MED ORDER — LIDOCAINE HCL (PF) 2 % IJ SOLN
INTRAMUSCULAR | Status: DC | PRN
  Administered 2020-03-18: 1.5 mg/kg/h via INTRADERMAL

## 2020-03-18 MED ORDER — EPHEDRINE SULFATE 50 MG/ML IJ SOLN
INTRAMUSCULAR | Status: DC | PRN
  Administered 2020-03-18 (×2): 10 mg via INTRAVENOUS

## 2020-03-18 MED ORDER — ADULT MULTIVITAMIN W/MINERALS CH
1.0000 | ORAL_TABLET | Freq: Every day | ORAL | Status: DC
  Administered 2020-03-19 – 2020-03-21 (×3): 1 via ORAL
  Filled 2020-03-18 (×3): qty 1

## 2020-03-18 MED ORDER — PANTOPRAZOLE SODIUM 40 MG IV SOLR
40.0000 mg | Freq: Every day | INTRAVENOUS | Status: DC
  Administered 2020-03-18 – 2020-03-19 (×2): 40 mg via INTRAVENOUS
  Filled 2020-03-18 (×2): qty 40

## 2020-03-18 MED ORDER — HYDROCODONE-ACETAMINOPHEN 5-325 MG PO TABS
1.0000 | ORAL_TABLET | Freq: Four times a day (QID) | ORAL | Status: DC | PRN
  Administered 2020-03-20: 1 via ORAL
  Filled 2020-03-18: qty 1

## 2020-03-18 MED ORDER — ROSUVASTATIN CALCIUM 10 MG PO TABS
10.0000 mg | ORAL_TABLET | Freq: Every day | ORAL | Status: DC
  Administered 2020-03-19 – 2020-03-21 (×3): 10 mg via ORAL
  Filled 2020-03-18 (×3): qty 1

## 2020-03-18 MED ORDER — ENOXAPARIN SODIUM 40 MG/0.4ML ~~LOC~~ SOLN
40.0000 mg | SUBCUTANEOUS | Status: DC
  Administered 2020-03-19 – 2020-03-21 (×3): 40 mg via SUBCUTANEOUS
  Filled 2020-03-18 (×3): qty 0.4

## 2020-03-18 MED ORDER — CYCLOBENZAPRINE HCL 10 MG PO TABS
10.0000 mg | ORAL_TABLET | Freq: Three times a day (TID) | ORAL | Status: DC | PRN
  Administered 2020-03-18: 10 mg via ORAL
  Filled 2020-03-18: qty 1

## 2020-03-18 MED ORDER — LIDOCAINE 2% (20 MG/ML) 5 ML SYRINGE
INTRAMUSCULAR | Status: DC | PRN
  Administered 2020-03-18: 20 mg via INTRAVENOUS
  Administered 2020-03-18: 80 mg via INTRAVENOUS

## 2020-03-18 MED ORDER — ACETAMINOPHEN 325 MG PO TABS: 650.0000 mg | ORAL_TABLET | Freq: Four times a day (QID) | ORAL | Status: DC | PRN

## 2020-03-18 MED ORDER — IBUPROFEN 400 MG PO TABS: 600.0000 mg | ORAL_TABLET | Freq: Four times a day (QID) | ORAL | Status: DC | PRN

## 2020-03-18 MED ORDER — CHLORHEXIDINE GLUCONATE CLOTH 2 % EX PADS
6.0000 | MEDICATED_PAD | Freq: Once | CUTANEOUS | Status: AC
  Administered 2020-03-18: 6 via TOPICAL

## 2020-03-18 MED ORDER — AMLODIPINE BESYLATE 5 MG PO TABS
5.0000 mg | ORAL_TABLET | Freq: Every day | ORAL | Status: DC
  Administered 2020-03-19 – 2020-03-21 (×3): 5 mg via ORAL
  Filled 2020-03-18 (×3): qty 1

## 2020-03-18 MED ORDER — ONDANSETRON HCL 4 MG/2ML IJ SOLN: 4.0000 mg | Freq: Once | INTRAMUSCULAR | Status: DC | PRN

## 2020-03-18 MED ORDER — ACETAMINOPHEN 500 MG PO TABS
ORAL_TABLET | ORAL | Status: AC
  Administered 2020-03-18: 1000 mg via ORAL
  Filled 2020-03-18: qty 2

## 2020-03-18 MED ORDER — CARVEDILOL 12.5 MG PO TABS
12.5000 mg | ORAL_TABLET | Freq: Two times a day (BID) | ORAL | Status: DC
  Administered 2020-03-19 – 2020-03-21 (×5): 12.5 mg via ORAL
  Filled 2020-03-18 (×5): qty 1

## 2020-03-18 MED ORDER — CHLORHEXIDINE GLUCONATE 0.12 % MT SOLN: 15.0000 mL | Freq: Once | OROMUCOSAL | Status: AC

## 2020-03-18 MED ORDER — PHENYLEPHRINE HCL-NACL 20-0.9 MG/250ML-% IV SOLN
INTRAVENOUS | Status: DC | PRN
  Administered 2020-03-18: 25 ug/min via INTRAVENOUS

## 2020-03-18 MED ORDER — GABAPENTIN 100 MG PO CAPS
200.0000 mg | ORAL_CAPSULE | Freq: Three times a day (TID) | ORAL | Status: DC
  Administered 2020-03-18 – 2020-03-21 (×7): 200 mg via ORAL
  Filled 2020-03-18 (×7): qty 2

## 2020-03-18 MED ORDER — GLYCOPYRROLATE 0.2 MG/ML IJ SOLN
INTRAMUSCULAR | Status: DC | PRN
  Administered 2020-03-18 (×2): .2 mg via INTRAVENOUS

## 2020-03-18 MED ORDER — LORATADINE 10 MG PO TABS
10.0000 mg | ORAL_TABLET | Freq: Every day | ORAL | Status: DC
  Administered 2020-03-19 – 2020-03-21 (×3): 10 mg via ORAL
  Filled 2020-03-18 (×3): qty 1

## 2020-03-18 MED ORDER — VASOPRESSIN 20 UNIT/ML IV SOLN
INTRAVENOUS | Status: DC | PRN
  Administered 2020-03-18: 2 [IU] via INTRAVENOUS
  Administered 2020-03-18 (×5): 1 [IU] via INTRAVENOUS

## 2020-03-18 MED ORDER — FENTANYL CITRATE (PF) 250 MCG/5ML IJ SOLN
INTRAMUSCULAR | Status: AC
  Filled 2020-03-18: qty 5

## 2020-03-18 MED ORDER — MIDAZOLAM HCL 2 MG/2ML IJ SOLN
INTRAMUSCULAR | Status: AC
  Filled 2020-03-18: qty 2

## 2020-03-18 MED ORDER — ROCURONIUM BROMIDE 100 MG/10ML IV SOLN
INTRAVENOUS | Status: DC | PRN
  Administered 2020-03-18: 50 mg via INTRAVENOUS
  Administered 2020-03-18: 20 mg via INTRAVENOUS
  Administered 2020-03-18: 10 mg via INTRAVENOUS
  Administered 2020-03-18: 20 mg via INTRAVENOUS
  Administered 2020-03-18: 10 mg via INTRAVENOUS

## 2020-03-18 MED ORDER — THIAMINE HCL 100 MG PO TABS
100.0000 mg | ORAL_TABLET | Freq: Every day | ORAL | Status: DC
  Administered 2020-03-19 – 2020-03-21 (×3): 100 mg via ORAL
  Filled 2020-03-18 (×3): qty 1

## 2020-03-18 MED ORDER — CELECOXIB 200 MG PO CAPS
200.0000 mg | ORAL_CAPSULE | Freq: Two times a day (BID) | ORAL | Status: DC
  Administered 2020-03-18 – 2020-03-21 (×6): 200 mg via ORAL
  Filled 2020-03-18 (×7): qty 1

## 2020-03-18 SURGICAL SUPPLY — 89 items
APPLICATOR VISTASEAL 35 (MISCELLANEOUS) ×2 IMPLANT
BLADE CLIPPER SURG (BLADE) ×2 IMPLANT
BLADE SURG SZ10 CARB STEEL (BLADE) ×2 IMPLANT
BLADE SURG SZ11 CARB STEEL (BLADE) ×2 IMPLANT
CANISTER SUCT 1200ML W/VALVE (MISCELLANEOUS) ×2 IMPLANT
CANNULA REDUC XI 12-8 STAPL (CANNULA) ×1
CANNULA REDUCER 12-8 DVNC XI (CANNULA) ×1 IMPLANT
CHLORAPREP W/TINT 26 (MISCELLANEOUS) ×2 IMPLANT
CNTNR SPEC 2.5X3XGRAD LEK (MISCELLANEOUS) ×1
CONT SPEC 4OZ STER OR WHT (MISCELLANEOUS) ×1
CONTAINER SPEC 2.5X3XGRAD LEK (MISCELLANEOUS) ×1 IMPLANT
COVER TIP SHEARS 8 DVNC (MISCELLANEOUS) ×1 IMPLANT
COVER TIP SHEARS 8MM DA VINCI (MISCELLANEOUS) ×1
COVER WAND RF STERILE (DRAPES) IMPLANT
DEFOGGER SCOPE WARMER CLEARIFY (MISCELLANEOUS) ×2 IMPLANT
DERMABOND ADVANCED (GAUZE/BANDAGES/DRESSINGS)
DERMABOND ADVANCED .7 DNX12 (GAUZE/BANDAGES/DRESSINGS) IMPLANT
DRAPE ARM DVNC X/XI (DISPOSABLE) ×4 IMPLANT
DRAPE COLUMN DVNC XI (DISPOSABLE) ×1 IMPLANT
DRAPE DA VINCI XI ARM (DISPOSABLE) ×4
DRAPE DA VINCI XI COLUMN (DISPOSABLE) ×1
DRAPE UNDER BUTTOCK W/FLU (DRAPES) ×2 IMPLANT
DRSG OPSITE POSTOP 4X10 (GAUZE/BANDAGES/DRESSINGS) IMPLANT
DRSG OPSITE POSTOP 4X8 (GAUZE/BANDAGES/DRESSINGS) IMPLANT
ELECT CAUTERY BLADE 6.4 (BLADE) IMPLANT
ELECT REM PT RETURN 9FT ADLT (ELECTROSURGICAL) ×2
ELECTRODE REM PT RTRN 9FT ADLT (ELECTROSURGICAL) ×1 IMPLANT
GLOVE BIOGEL PI IND STRL 7.0 (GLOVE) ×4 IMPLANT
GLOVE BIOGEL PI INDICATOR 7.0 (GLOVE) ×4
GLOVE SURG SYN 6.5 ES PF (GLOVE) ×8 IMPLANT
GOWN STRL REUS W/ TWL LRG LVL3 (GOWN DISPOSABLE) ×6 IMPLANT
GOWN STRL REUS W/TWL LRG LVL3 (GOWN DISPOSABLE) ×6
GRASPER SUT TROCAR 14GX15 (MISCELLANEOUS) IMPLANT
HANDLE YANKAUER SUCT BULB TIP (MISCELLANEOUS) ×2 IMPLANT
IRRIGATION STRYKERFLOW (MISCELLANEOUS) ×1 IMPLANT
IRRIGATOR STRYKERFLOW (MISCELLANEOUS) ×2
IV NS 1000ML (IV SOLUTION) ×1
IV NS 1000ML BAXH (IV SOLUTION) ×1 IMPLANT
KIT IMAGING PINPOINTPAQ (MISCELLANEOUS) ×2 IMPLANT
KIT PINK PAD W/HEAD ARE REST (MISCELLANEOUS) ×2
KIT PINK PAD W/HEAD ARM REST (MISCELLANEOUS) ×1 IMPLANT
LABEL OR SOLS (LABEL) IMPLANT
LEGGING LITHOTOMY PAIR STRL (DRAPES) ×2 IMPLANT
NEEDLE HYPO 22GX1.5 SAFETY (NEEDLE) ×2 IMPLANT
NEEDLE INSUFFLATION 14GA 120MM (NEEDLE) ×2 IMPLANT
OBTURATOR OPTICAL STANDARD 8MM (TROCAR) ×1
OBTURATOR OPTICAL STND 8 DVNC (TROCAR) ×1
OBTURATOR OPTICALSTD 8 DVNC (TROCAR) ×1 IMPLANT
PACK COLON CLEAN CLOSURE (MISCELLANEOUS) ×2 IMPLANT
PACK LAP CHOLECYSTECTOMY (MISCELLANEOUS) ×2 IMPLANT
PENCIL ELECTRO HAND CTR (MISCELLANEOUS) ×2 IMPLANT
RELOAD STAPLER 3.5X45 BLU DVNC (STAPLE) IMPLANT
RELOAD STAPLER 3.5X60 BLU DVNC (STAPLE) ×3 IMPLANT
SEAL CANN UNIV 5-8 DVNC XI (MISCELLANEOUS) ×3 IMPLANT
SEAL XI 5MM-8MM UNIVERSAL (MISCELLANEOUS) ×3
SEALER VESSEL DA VINCI XI (MISCELLANEOUS) ×1
SEALER VESSEL EXT DVNC XI (MISCELLANEOUS) ×1 IMPLANT
SLEEVE ENDOPATH XCEL 5M (ENDOMECHANICALS) IMPLANT
SOL PREP PVP 2OZ (MISCELLANEOUS) ×2
SOLUTION ELECTROLUBE (MISCELLANEOUS) ×2 IMPLANT
SOLUTION PREP PVP 2OZ (MISCELLANEOUS) ×1 IMPLANT
STAPLER 45 DA VINCI SURE FORM (STAPLE)
STAPLER 45 SUREFORM DVNC (STAPLE) IMPLANT
STAPLER 60 DA VINCI SURE FORM (STAPLE) ×1
STAPLER 60 SUREFORM DVNC (STAPLE) ×1 IMPLANT
STAPLER CANNULA SEAL DVNC XI (STAPLE) ×1 IMPLANT
STAPLER CANNULA SEAL XI (STAPLE) ×1
STAPLER CIRCULAR 29MM (STAPLE) IMPLANT
STAPLER CIRCULAR MANUAL XL 29 (STAPLE) ×2 IMPLANT
STAPLER RELOAD 3.5X45 BLU DVNC (STAPLE)
STAPLER RELOAD 3.5X45 BLUE (STAPLE)
STAPLER RELOAD 3.5X60 BLU DVNC (STAPLE) ×3
STAPLER RELOAD 3.5X60 BLUE (STAPLE) ×3
STAPLER SKIN PROX 35W (STAPLE) IMPLANT
SURGILUBE 2OZ TUBE FLIPTOP (MISCELLANEOUS) ×2 IMPLANT
SUT MNCRL AB 4-0 PS2 18 (SUTURE) ×6 IMPLANT
SUT PDS AB 1 CT1 36 (SUTURE) ×6 IMPLANT
SUT V-LOC 90 ABS 3-0 VLT  V-20 (SUTURE) ×1
SUT V-LOC 90 ABS 3-0 VLT V-20 (SUTURE) ×1 IMPLANT
SUT VIC AB 3-0 SH 27 (SUTURE) ×3
SUT VIC AB 3-0 SH 27X BRD (SUTURE) ×3 IMPLANT
SUT VICRYL 0 AB UR-6 (SUTURE) ×6 IMPLANT
SYR 30ML LL (SYRINGE) ×4 IMPLANT
SYS LAPSCP GELPORT 120MM (MISCELLANEOUS) ×2
SYSTEM LAPSCP GELPORT 120MM (MISCELLANEOUS) ×1 IMPLANT
SYSTEM WECK SHIELD CLOSURE (TROCAR) IMPLANT
TRAY FOLEY MTR SLVR 16FR STAT (SET/KITS/TRAYS/PACK) ×2 IMPLANT
TROCAR XCEL NON-BLD 5MMX100MML (ENDOMECHANICALS) IMPLANT
TUBING EVAC SMOKE HEATED PNEUM (TUBING) ×2 IMPLANT

## 2020-03-18 NOTE — Progress Notes (Addendum)
   03/18/20 0750  Clinical Encounter Type  Visited With Family  Visit Type Initial  Referral From Chaplain  Consult/Referral To Chaplain  While rounding [facility] waiting area, chaplain spoke to pt's family, to find out how they were doing. Pt's family member said they were doing fine and they didn't have any questions or concerns.

## 2020-03-18 NOTE — Plan of Care (Signed)
Continuing with plan of care. 

## 2020-03-18 NOTE — Transfer of Care (Signed)
Immediate Anesthesia Transfer of Care Note  Patient: Todd Beard  Procedure(s) Performed: XI ROBOT ASSISTED SIGMOID COLECTOMY (N/A Abdomen)  Patient Location: PACU  Anesthesia Type:General  Level of Consciousness: awake, alert  and oriented  Airway & Oxygen Therapy: Patient Spontanous Breathing  Post-op Assessment: Report given to RN and Post -op Vital signs reviewed and stable  Post vital signs: Reviewed and stable  Last Vitals:  Vitals Value Taken Time  BP 102/51 03/18/20 1619  Temp    Pulse 66 03/18/20 1621  Resp 20 03/18/20 1621  SpO2 96 % 03/18/20 1621  Vitals shown include unvalidated device data.  Last Pain:  Vitals:   03/18/20 0803  TempSrc: Temporal  PainSc: 0-No pain         Complications: No complications documented.

## 2020-03-18 NOTE — Op Note (Signed)
Preoperative diagnosis: Sigmoid Colon cancer Postoperative diagnosis: Same  Procedure: Robotic assisted laparoscopic sigmoidectomy.   Anesthesia: GETA   Surgeon: Benjamine Sprague Assistant: cINTRON for exposure and bedside assist    Wound Classification: clean contaminated   Specimen: Sigmoid colon   Complications: None   Estimated Blood Loss: 200 mL  Indications: Patient presented with above.  Please see H&P for further details.     FIndings: 1.  Sigmoid colon with ink margins 2.  Successful EEA anastomosis with no air leak  3.  Normal anatomy 4.  Adequate hemostasis.   Operation performed with curative intent:Yes  Tumor Location:Sigmoid colon  Extent of colon and vascular resection:Sigmoid resection - inferior mesenteric   Description of procedure: The patient was placed on the operating table in the low lithotomy position, both arms tucked. General anesthesia was induced.  Foley placed. A time-out was completed verifying correct patient, procedure, site, positioning, and implant(s) and/or special equipment prior to beginning this procedure. The abdomen was prepped and draped in the usual sterile fashion.    Palmer's point located and Veress needle was inserted.  After confirming 2 clicks and a positive saline drop test, gas insufflation was initiated until the abdominal pressure was measured at 15 mmHg.  Afterwards, the Veress needle was removed and a 8 mm port was placed through the same site using Optiview technique after extending the incision with an 11 blade.  After local was infused, 3 additional incision was made 8 cm apart from the palmer's point port, extending towards the right lower quadrant.  Under direct visualization, 2 8 mm port was placed through the incisions closest to Palmer's point.  12 mm port placed through the the right lower quadrant incision where gelport was placed prior in anticipation for specimen extraction.  An additional 5 mm port was then placed in the  right upper quadrant as an assistant port.  No injuries from trocar placements were noted. The table was placed in the Trendelenburg, right side down position. Xi robotic platform was then brought to the operative field and docked.  Fenestrated forceps and tip up graspers were placed in the left arm ports and scissors were placed in the right hand port.   Inspection of the sigmoid colon, noted ink at  Low sigmoid section.  During small bowel retraction for better mesentary view, a small tear was noted with active bleeding.  This was controlled with vessel seal and suture ligation using 3-0 vicry.  Hemostasis confirmed and portion of ileum adjacent to tear remained healthy throughout entire procedure.    Sigmoid colon Lateral attachments were taken down using sharp dissection.  Area  8cm proximal to the ink margin was chosen to create a mesenteric window, and a blue load linear cutting stapler was used to divide and staple the rectum.  The distal end of the sigmoid was similarly divided by finding a area immediately distal to the distal inked margin.  The mesocolon was then divided using vessel sealer until the sigmoid colon was free from all attachments.  ICG was infused and adequate circulation was noted at both staple lines. The 12 mm port was then removed, sigmoid colon specimen was removed through the RLQ incision, and opened at tableside, confirming presence of sigmoid mass.    and a 29 mm EEA stapler anvil was placed through the port site.  The 12 mm port was then replaced.  Enterotomy made at tenia  Proximal to staple and  and a 3-0 V lock pursestring suture was  placed around this area after placing the anvil through it.  After noting clear edges around the anvil for staple placement, EEA stapler placed through the rectal stump and guided to the distal staple line and a side -to-end anastomosis was created, after confirming no twisting of the proximal mesentery and no tension noted on the staple line.   Saline was infused into the pelvis, and a air leak test was performed and anastomosis did not show any leaks.  All saline was then suctioned out, anastomosis looked viable, vessel sealer placed around dissected areas, and hemostasis was noted throughout the abdomen.    Exparel infused as TAP block, and then RLQ incision closed with 0 vicryl x2 for anterior and posterior fascia.  3-0 Vicryl used to approximate the subcutaneous tissue at RLQ incision, prior to closing the skin with 4-0 Monocryl at all port sites.  Dermabond was used to close the port sites    The patient tolerated the procedure well, awakened from anesthesia and was taken to the postanesthesia care unit in satisfactory condition with Foley in place.  Sponge and instrument count correct at end of procedure.

## 2020-03-18 NOTE — Anesthesia Preprocedure Evaluation (Signed)
Anesthesia Evaluation  Patient identified by MRN, date of birth, ID band Patient awake    Reviewed: Allergy & Precautions, H&P , NPO status , Patient's Chart, lab work & pertinent test results  History of Anesthesia Complications Negative for: history of anesthetic complications  Airway Mallampati: II  TM Distance: >3 FB     Dental  (+) Edentulous Lower, Edentulous Upper   Pulmonary neg shortness of breath, neg sleep apnea, neg COPD, Current Smoker (46 pack year history)Patient did not abstain from smoking.,    breath sounds clear to auscultation       Cardiovascular hypertension, (-) angina(-) Past MI, (-) Cardiac Stents, (-) CABG and (-) CHF (-) dysrhythmias  Rhythm:regular Rate:Normal  Normal echo in 2016   Neuro/Psych Anxiety negative neurological ROS  negative psych ROS   GI/Hepatic negative GI ROS, (+) Hepatitis -  Endo/Other  negative endocrine ROS  Renal/GU      Musculoskeletal   Abdominal   Peds  Hematology negative hematology ROS (+)   Anesthesia Other Findings Past Medical History: No date: Anxiety No date: Back pain with sciatica     Comment:  and leg No date: Complication of anesthesia     Comment:  oxygen saturation decline  No date: Degenerative disc disease, lumbar No date: Hepatitis No date: Hypercholesterolemia No date: Hypertension No date: Hypomagnesemia No date: Iron deficiency  Past Surgical History: 12/10/2017: BROW LIFT; Bilateral     Comment:  Procedure: BLEPHAROPLASTY UPPER EYELID WITH EXCESS SKIN;              Surgeon: Karle Starch, MD;  Location: [facility];  Service: Ophthalmology;  Laterality: Bilateral;                bilateral  02/05/2020: COLONOSCOPY WITH PROPOFOL; N/A     Comment:  Procedure: COLONOSCOPY WITH PROPOFOL;  Surgeon:               [name], MD;  Location: [facility];                Service: Endoscopy;  Laterality:  N/A; 02/22/2020: COLONOSCOPY WITH PROPOFOL; N/A     Comment:  Procedure: COLONOSCOPY WITH PROPOFOL;  Surgeon:               [name], MD;  Location: [facility];                Service: Endoscopy;  Laterality: N/A; 02/22/2020: ESOPHAGOGASTRODUODENOSCOPY (EGD) WITH PROPOFOL; N/A     Comment:  Procedure: ESOPHAGOGASTRODUODENOSCOPY (EGD) WITH               PROPOFOL;  Surgeon: [name], MD;  Location:               [facility];  Service: Endoscopy;  Laterality: N/A; No date: JOINT REPLACEMENT 12/10/2017: PTOSIS REPAIR; Bilateral     Comment:  Procedure: PTOSIS REPAIR RESECT EX;  Surgeon: Karle Starch, MD;  Location: [facility];  Service:               Ophthalmology;  Laterality: Bilateral; No date: TOTAL HIP ARTHROPLASTY; Left  BMI    Body Mass Index: 35.43 kg/m      Reproductive/Obstetrics negative OB ROS  Anesthesia Physical Anesthesia Plan  ASA: III  Anesthesia Plan: General ETT   Post-op Pain Management:    Induction:   PONV Risk Score and Plan: Ondansetron, Dexamethasone and Treatment may vary due to age or medical condition  Airway Management Planned:   Additional Equipment:   Intra-op Plan:   Post-operative Plan:   Informed Consent: I have reviewed the patients History and Physical, chart, labs and discussed the procedure including the risks, benefits and alternatives for the proposed anesthesia with the patient or authorized representative who has indicated his/her understanding and acceptance.     Dental Advisory Given  Plan Discussed with: Anesthesiologist, CRNA and Surgeon  Anesthesia Plan Comments:         Anesthesia Quick Evaluation

## 2020-03-18 NOTE — Anesthesia Procedure Notes (Signed)
Procedure Name: Intubation Date/Time: 03/18/2020 9:52 AM Performed by: Marsh Dolly, CRNA Pre-anesthesia Checklist: Patient identified, Patient being monitored, Timeout performed, Emergency Drugs available and Suction available Patient Re-evaluated:Patient Re-evaluated prior to induction Oxygen Delivery Method: Circle system utilized Preoxygenation: Pre-oxygenation with 100% oxygen Induction Type: IV induction Ventilation: Mask ventilation without difficulty Laryngoscope Size: 3 and Miller Grade View: Grade I Tube type: Oral Tube size: 7.5 mm Number of attempts: 1 Placement Confirmation: ETT inserted through vocal cords under direct vision,  positive ETCO2 and breath sounds checked- equal and bilateral Secured at: 21 cm Tube secured with: Tape Dental Injury: Teeth and Oropharynx as per pre-operative assessment

## 2020-03-18 NOTE — Interval H&P Note (Signed)
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  The various methods of treatment have been discussed with the patient and family. After consideration of risks, benefits and other options for treatment, the patient has consented to  Procedure(s): XI ROBOT ASSISTED SIGMOID COLECTOMY (N/A) as a surgical intervention.  The patient's history has been reviewed, patient examined, no change in status, stable for surgery.  I have reviewed the patient's chart and labs.  Questions were answered to the patient's satisfaction.     [name]

## 2020-03-19 LAB — BASIC METABOLIC PANEL
Anion gap: 11 (ref 5–15)
BUN: 17 mg/dL (ref 8–23)
CO2: 26 mmol/L (ref 22–32)
Calcium: 6.9 mg/dL — ABNORMAL LOW (ref 8.9–10.3)
Chloride: 101 mmol/L (ref 98–111)
Creatinine, Ser: 1.56 mg/dL — ABNORMAL HIGH (ref 0.61–1.24)
GFR calc Af Amer: 52 mL/min — ABNORMAL LOW (ref >60)
GFR calc non Af Amer: 45 mL/min — ABNORMAL LOW (ref >60)
Glucose, Bld: 111 mg/dL — ABNORMAL HIGH (ref 70–99)
Potassium: 2.5 mmol/L — CL (ref 3.5–5.1)
Sodium: 138 mmol/L (ref 135–145)

## 2020-03-19 LAB — CBC
HCT: 36.4 % — ABNORMAL LOW (ref 39.0–52.0)
Hemoglobin: 12.5 g/dL — ABNORMAL LOW (ref 13.0–17.0)
MCH: 34.1 pg — ABNORMAL HIGH (ref 26.0–34.0)
MCHC: 34.3 g/dL (ref 30.0–36.0)
MCV: 99.2 fL (ref 80.0–100.0)
Platelets: 98 10*3/uL — ABNORMAL LOW (ref 150–400)
RBC: 3.67 MIL/uL — ABNORMAL LOW (ref 4.22–5.81)
RDW: 15.8 % — ABNORMAL HIGH (ref 11.5–15.5)
WBC: 5 10*3/uL (ref 4.0–10.5)
nRBC: 0 % (ref 0.0–0.2)

## 2020-03-19 MED ORDER — POTASSIUM CHLORIDE 20 MEQ PO PACK
40.0000 meq | PACK | Freq: Two times a day (BID) | ORAL | Status: DC
  Administered 2020-03-19 – 2020-03-21 (×5): 40 meq via ORAL
  Filled 2020-03-19 (×5): qty 2

## 2020-03-19 MED ORDER — SODIUM CHLORIDE 0.9 % IV BOLUS: 500.0000 mL | Freq: Once | INTRAVENOUS | Status: DC

## 2020-03-19 MED ORDER — MAGNESIUM SULFATE 2 GM/50ML IV SOLN
2.0000 g | Freq: Once | INTRAVENOUS | Status: AC
  Administered 2020-03-19: 2 g via INTRAVENOUS
  Filled 2020-03-19: qty 50

## 2020-03-19 NOTE — Progress Notes (Signed)
Subjective:  CC: Todd Beard is a 69 y.o. male  Hospital stay day 1, 1 Day Post-Op robo assisted sigmoidectomy for colon CA  HPI: No issues overnight.  No pain, no flatus or BM yet.  ROS:  General: Denies weight loss, weight gain, fatigue, fevers, chills, and night sweats. Heart: Denies chest pain, palpitations, racing heart, irregular heartbeat, leg pain or swelling, and decreased activity tolerance. Respiratory: Denies breathing difficulty, shortness of breath, wheezing, cough, and sputum. GI: Denies change in appetite, heartburn, nausea, vomiting, constipation, diarrhea, and blood in stool. GU: Denies difficulty urinating, pain with urinating, urgency, frequency, blood in urine.   Objective:   Temp:  [97.1 F (36.2 C)-98.1 F (36.7 C)] 97.6 F (36.4 C) (09/11 0459) Pulse Rate:  [63-72] 71 (09/11 0546) Resp:  [7-22] 16 (09/11 0459) BP: (90-127)/(44-59) 109/59 (09/11 0459) SpO2:  [84 %-98 %] 91 % (09/11 0546) Weight:  [90.7 kg] 90.7 kg (09/10 0803)     Height: 5\' 3"  (160 cm) Weight: 90.7 kg BMI (Calculated): 35.44   Intake/Output this shift:   Intake/Output Summary (Last 24 hours) at 03/19/2020 0549 Last data filed at 03/19/2020 0500 Gross per 24 hour  Intake 3567.76 ml  Output 1655 ml  Net 1912.76 ml    Constitutional :  alert, cooperative, appears stated age and no distress  Respiratory:  clear to auscultation bilaterally  Cardiovascular:  regular rate and rhythm  Gastrointestinal: soft, non-tender; bowel sounds normal; no masses,  no organomegaly.   Skin: Cool and moist. Incisions c/d/i  Psychiatric: Normal affect, non-agitated, not confused       LABS:  CMP Latest Ref Rng & Units 03/18/2020 03/11/2020 07/03/2017  Glucose 70 - 99 mg/dL - 117(H) 97  BUN 8 - 23 mg/dL - 19 16  Creatinine 0.61 - 1.24 mg/dL 1.69(H) 2.36(H) 1.27(H)  Sodium 135 - 145 mmol/L - 138 132(L)  Potassium 3.5 - 5.1 mmol/L - 3.0(L) 3.6  Chloride 98 - 111 mmol/L - 104 99(L)  CO2 22 - 32 mmol/L  - 19(L) 22  Calcium 8.9 - 10.3 mg/dL - 8.1(L) 8.7(L)  Total Protein 6.5 - 8.1 g/dL - 7.7 8.1  Total Bilirubin 0.3 - 1.2 mg/dL - 1.5(H) 1.4(H)  Alkaline Phos 38 - 126 U/L - 160(H) 232(H)  AST 15 - 41 U/L - 46(H) 40  ALT 0 - 44 U/L - 25 19   CBC Latest Ref Rng & Units 03/18/2020 03/11/2020 07/03/2017  WBC 4.0 - 10.5 K/uL 5.0 5.7 5.8  Hemoglobin 13.0 - 17.0 g/dL 12.8(L) 13.9 14.7  Hematocrit 39 - 52 % 37.8(L) 41.1 44.5  Platelets 150 - 400 K/uL 110(L) 143(L) 216    RADS: n/a Assessment:   S/p robo assisted lap sigmoidectomy for colon CA.  Doing well, repeat vital check showed O2 sat at 90% on 4L [place].  Will continue to monitor since he is otherwise stable.  PT eval today, CLD, foley out, D/C IVF.

## 2020-03-19 NOTE — Progress Notes (Signed)
PT Cancellation Note  Patient Details Name: Todd Beard MRN: [id number] DOB: 14-Jan-1951   Cancelled Treatment:    Reason Eval/Treat Not Completed: Patient not medically ready PT hold eval this am d/t Potassium at critical low (2.5) with downward 24 hour trend. Will re-assess PT readiness as medically able.   Durwin Reges DPT Durwin Reges 03/19/2020, 8:58 AM

## 2020-03-19 NOTE — Plan of Care (Signed)
Continuing with plan of care. 

## 2020-03-19 NOTE — TOC Progression Note (Signed)
Transition of Care [facility]) - Progression Note    Patient Details  Name: Todd Beard MRN: [id number] Date of Birth: Jul 01, 1951  Transition of Care [facility]) CM/SW [place], LCSW Phone Number: 03/19/2020, 10:35 AM  Clinical Narrative:    CSW met with patient at bedside and provided substance abuse resources. Explained to patient general process and encouraged patiet to contact CSW if additional assistance is needed.           Expected Discharge Plan and Services                                                 Social Determinants of Health (SDOH) Interventions    Readmission Risk Interventions No flowsheet data found.

## 2020-03-20 LAB — CBC
HCT: 35.8 % — ABNORMAL LOW (ref 39.0–52.0)
Hemoglobin: 11.9 g/dL — ABNORMAL LOW (ref 13.0–17.0)
MCH: 34.1 pg — ABNORMAL HIGH (ref 26.0–34.0)
MCHC: 33.2 g/dL (ref 30.0–36.0)
MCV: 102.6 fL — ABNORMAL HIGH (ref 80.0–100.0)
Platelets: 102 10*3/uL — ABNORMAL LOW (ref 150–400)
RBC: 3.49 MIL/uL — ABNORMAL LOW (ref 4.22–5.81)
RDW: 15.9 % — ABNORMAL HIGH (ref 11.5–15.5)
WBC: 5.5 10*3/uL (ref 4.0–10.5)
nRBC: 0 % (ref 0.0–0.2)

## 2020-03-20 LAB — BASIC METABOLIC PANEL
Anion gap: 6 (ref 5–15)
BUN: 14 mg/dL (ref 8–23)
CO2: 29 mmol/L (ref 22–32)
Calcium: 7.1 mg/dL — ABNORMAL LOW (ref 8.9–10.3)
Chloride: 103 mmol/L (ref 98–111)
Creatinine, Ser: 1.56 mg/dL — ABNORMAL HIGH (ref 0.61–1.24)
GFR calc Af Amer: 52 mL/min — ABNORMAL LOW (ref >60)
GFR calc non Af Amer: 45 mL/min — ABNORMAL LOW (ref >60)
Glucose, Bld: 105 mg/dL — ABNORMAL HIGH (ref 70–99)
Potassium: 2.6 mmol/L — CL (ref 3.5–5.1)
Sodium: 138 mmol/L (ref 135–145)

## 2020-03-20 LAB — MAGNESIUM: Magnesium: 1.6 mg/dL — ABNORMAL LOW (ref 1.7–2.4)

## 2020-03-20 MED ORDER — MAGNESIUM SULFATE 2 GM/50ML IV SOLN
2.0000 g | Freq: Once | INTRAVENOUS | Status: AC
  Administered 2020-03-20: 2 g via INTRAVENOUS
  Filled 2020-03-20: qty 50

## 2020-03-20 MED ORDER — PANTOPRAZOLE SODIUM 40 MG PO TBEC
40.0000 mg | DELAYED_RELEASE_TABLET | Freq: Every day | ORAL | Status: DC
  Administered 2020-03-20: 40 mg via ORAL
  Filled 2020-03-20: qty 1

## 2020-03-20 MED ORDER — POTASSIUM CHLORIDE 10 MEQ/100ML IV SOLN
10.0000 meq | INTRAVENOUS | Status: DC
  Administered 2020-03-20 (×2): 10 meq via INTRAVENOUS
  Filled 2020-03-20 (×2): qty 100

## 2020-03-20 NOTE — Progress Notes (Signed)
PT Cancellation Note  Patient Details Name: Todd Beard MRN: [id number] DOB: 1951-03-09   Cancelled Treatment:    Reason Eval/Treat Not Completed: Patient not medically ready (Patient consult received and reviewed, Potassium outside of therapeutic range (2.6). Will continue to follow this patient and attempt again at later time as appropriate/available.)  Janna Arch, PT, DPT   03/20/2020, 8:58 AM

## 2020-03-20 NOTE — Plan of Care (Signed)
Continuing with plan of care. 

## 2020-03-20 NOTE — Progress Notes (Signed)
PHARMACIST - PHYSICIAN COMMUNICATION  DR:   Lysle Pearl  CONCERNING: IV to Oral Route Change Policy  RECOMMENDATION: This patient is receiving pantoprazole/thiamine by the intravenous route.  Based on criteria approved by the Pharmacy and Therapeutics Committee, the intravenous medication(s) is/are being converted to the equivalent oral dose form(s).   DESCRIPTION: These criteria include:  The patient is eating (either orally or via tube) and/or has been taking other orally administered medications for a least 24 hours  The patient has no evidence of active gastrointestinal bleeding or impaired GI absorption (gastrectomy, short bowel, patient on TNA or NPO).  If you have questions about this conversion, please contact the Pharmacy Department  []   [phone] )  [facility] [x]   [phone] )  [facility] []   [phone] )  [facility] []   [phone] )  [facility] []   [phone] )  [place], PharmD, BCPS Clinical Pharmacist 03/20/2020 10:09 AM

## 2020-03-20 NOTE — Progress Notes (Signed)
Subjective:  CC: Todd Beard is a 69 y.o. male  Hospital stay day 2, 2 Days Post-Op robo assisted sigmoidectomy for colon CA  HPI: No issues overnight.  No pain.  Reports passing flatus now.  ROS:  General: Denies weight loss, weight gain, fatigue, fevers, chills, and night sweats. Heart: Denies chest pain, palpitations, racing heart, irregular heartbeat, leg pain or swelling, and decreased activity tolerance. Respiratory: Denies breathing difficulty, shortness of breath, wheezing, cough, and sputum. GI: Denies change in appetite, heartburn, nausea, vomiting, constipation, diarrhea, and blood in stool. GU: Denies difficulty urinating, pain with urinating, urgency, frequency, blood in urine.   Objective:   Temp:  [98.4 F (36.9 C)-98.7 F (37.1 C)] 98.4 F (36.9 C) (09/12 0513) Pulse Rate:  [72-76] 76 (09/12 0513) Resp:  [16-18] 18 (09/12 0513) BP: (104-119)/(53-66) 118/66 (09/12 0513) SpO2:  [90 %-98 %] 94 % (09/12 0513) FiO2 (%):  [16 %] 16 % (09/11 2053)     Height: 5\' 3"  (160 cm) Weight: 90.7 kg BMI (Calculated): 35.44   Intake/Output this shift:   Intake/Output Summary (Last 24 hours) at 03/20/2020 1062 Last data filed at 03/19/2020 1200 Gross per 24 hour  Intake 50 ml  Output --  Net 50 ml    Constitutional :  alert, cooperative, appears stated age and no distress  Respiratory:  clear to auscultation bilaterally  Cardiovascular:  regular rate and rhythm  Gastrointestinal: soft, non-tender; bowel sounds normal; no masses,  no organomegaly.   Skin: Cool and moist. Incisions c/d/i, with some bruising around all sites.  Psychiatric: Normal affect, non-agitated, not confused       LABS:  CMP Latest Ref Rng & Units 03/20/2020 03/19/2020 03/18/2020  Glucose 70 - 99 mg/dL 105(H) 111(H) -  BUN 8 - 23 mg/dL 14 17 -  Creatinine 0.61 - 1.24 mg/dL 1.56(H) 1.56(H) 1.69(H)  Sodium 135 - 145 mmol/L 138 138 -  Potassium 3.5 - 5.1 mmol/L 2.6(LL) 2.5(LL) -  Chloride 98 - 111  mmol/L 103 101 -  CO2 22 - 32 mmol/L 29 26 -  Calcium 8.9 - 10.3 mg/dL 7.1(L) 6.9(L) -  Total Protein 6.5 - 8.1 g/dL - - -  Total Bilirubin 0.3 - 1.2 mg/dL - - -  Alkaline Phos 38 - 126 U/L - - -  AST 15 - 41 U/L - - -  ALT 0 - 44 U/L - - -   CBC Latest Ref Rng & Units 03/20/2020 03/19/2020 03/18/2020  WBC 4.0 - 10.5 K/uL 5.5 5.0 5.0  Hemoglobin 13.0 - 17.0 g/dL 11.9(L) 12.5(L) 12.8(L)  Hematocrit 39 - 52 % 35.8(L) 36.4(L) 37.8(L)  Platelets 150 - 400 K/uL 102(L) 98(L) 110(L)    RADS: n/a Assessment:   S/p robo assisted lap sigmoidectomy for colon CA.  Doing well, advance to full.  Recheck Mg this am, before starting K replacement.  Low K supposedly chronic issue for patient.  Encourage ambulation

## 2020-03-21 LAB — PHOSPHORUS: Phosphorus: 1.6 mg/dL — ABNORMAL LOW (ref 2.5–4.6)

## 2020-03-21 LAB — BASIC METABOLIC PANEL
Anion gap: 10 (ref 5–15)
BUN: 12 mg/dL (ref 8–23)
CO2: 25 mmol/L (ref 22–32)
Calcium: 8 mg/dL — ABNORMAL LOW (ref 8.9–10.3)
Chloride: 99 mmol/L (ref 98–111)
Creatinine, Ser: 1.42 mg/dL — ABNORMAL HIGH (ref 0.61–1.24)
GFR calc Af Amer: 59 mL/min — ABNORMAL LOW (ref >60)
GFR calc non Af Amer: 51 mL/min — ABNORMAL LOW (ref >60)
Glucose, Bld: 128 mg/dL — ABNORMAL HIGH (ref 70–99)
Potassium: 3.3 mmol/L — ABNORMAL LOW (ref 3.5–5.1)
Sodium: 134 mmol/L — ABNORMAL LOW (ref 135–145)

## 2020-03-21 LAB — MAGNESIUM: Magnesium: 1.9 mg/dL (ref 1.7–2.4)

## 2020-03-21 MED ORDER — HYDROCODONE-ACETAMINOPHEN 5-325 MG PO TABS
1.0000 | ORAL_TABLET | Freq: Four times a day (QID) | ORAL | 0 refills | Status: DC | PRN
Start: 2020-03-21 — End: 2023-06-04

## 2020-03-21 MED ORDER — IBUPROFEN 800 MG PO TABS: 800.0000 mg | ORAL_TABLET | Freq: Two times a day (BID) | ORAL | 0 refills | Status: DC | PRN

## 2020-03-21 NOTE — Discharge Instructions (Signed)
Laparoscopic Colectomy, Care After This sheet gives you information about how to care for yourself after your procedure. Your health care provider may also give you more specific instructions. If you have problems or questions, contact your health care provider. What can I expect after the procedure? After your procedure, it is common to have the following:  Pain in your abdomen, especially in the incision areas. You will be given medicine to control the pain.  Tiredness. This is a normal part of the recovery process. Your energy level will return to normal over the next several weeks.  Changes in your bowel movements, such as constipation or needing to go more often. Talk with your health care provider about how to manage this. Follow these instructions at home: Medicines  advil as needed for discomfort.     Use narcotics, if prescribed, only when motrin is not enough to control pain.    Advil up to 800mg  per dose every 8hrs as needed for pain.    Please continue to take Magnesium and Potassium supplements as previously prescribed since your levels were low during this admission.  Do not drive or use heavy machinery while taking prescription pain medicine.  Do not drink alcohol while taking prescription pain medicine.  If you were prescribed an antibiotic medicine, use it as told by your health care provider. Do not stop using the antibiotic even if you start to feel better. Incision care     Follow instructions from your health care provider about how to take care of your incision areas. Make sure you: ? Keep your incisions clean and dry. ? Wash your hands with soap and water before and after applying medicine to the areas, and before and after changing your bandage (dressing). If soap and water are not available, use hand sanitizer. ? Change your dressing as told by your health care provider. ? Leave stitches (sutures), skin glue, or adhesive strips in place. These skin closures  may need to stay in place for 2 weeks or longer. If adhesive strip edges start to loosen and curl up, you may trim the loose edges. Do not remove adhesive strips completely unless your health care provider tells you to do that.  Do not wear tight clothing over the incisions. Tight clothing may rub and irritate the incision areas, which may cause the incisions to open.  Do not take baths, swim, or use a hot tub until your health care provider approves. OK TO SHOWER.    Check your incision area every day for signs of infection. Check for: ? More redness, swelling, or pain. ? More fluid or blood. ? Warmth. ? Pus or a bad smell. Activity  Avoid lifting anything that is heavier than 10 lb (4.5 kg) for 2 weeks or until your health care provider says it is okay.  You may resume normal activities as told by your health care provider. Ask your health care provider what activities are safe for you.  Take rest breaks during the day as needed. Eating and drinking  Follow instructions from your health care provider about what you can eat after surgery.  To prevent or treat constipation while you are taking prescription pain medicine, your health care provider may recommend that you: ? Drink enough fluid to keep your urine clear or pale yellow. ? Take over-the-counter or prescription medicines. ? Eat foods that are high in fiber, such as fresh fruits and vegetables, whole grains, and beans. ? Limit foods that are high in fat  and processed sugars, such as fried and sweet foods. General instructions  Ask your health care provider when you will need an appointment to get your sutures or staples removed.  Keep all follow-up visits as told by your health care provider. This is important. Contact a health care provider if:  You have more redness, swelling, or pain around your incisions.  You have more fluid or blood coming from the incisions.  Your incisions feel warm to the touch.  You have pus  or a bad smell coming from your incisions or your dressing.  You have a fever.  You have an incision that breaks open (edges not staying together) after sutures or staples have been removed. Get help right away if:  You develop a rash.  You have chest pain or difficulty breathing.  You have pain or swelling in your legs.  You feel light-headed or you faint.  Your abdomen swells (becomes distended).  You have nausea or vomiting.  You have blood in your stool (feces). This information is not intended to replace advice given to you by your health care provider. Make sure you discuss any questions you have with your health care provider. Document Released: 01/12/2005 Document Revised: 03/14/2018 Document Reviewed: 03/26/2016 Elsevier Interactive Patient Education  2019 [employer].

## 2020-03-21 NOTE — Discharge Summary (Signed)
Physician Discharge Summary  Patient ID: Todd Beard MRN: [id number] DOB/AGE: [birth year] 67 y.o.  Admit date: 03/18/2020 Discharge date: March 21, 2020  Admission Diagnoses: Sigmoid colon cancer  Discharge Diagnoses:  Same as above  Discharged Condition: fair  Hospital Course: Admitted for above.  Underwent robotic assisted laparoscopic sigmoidectomy.  Please see op note for further details.  Postop, recovered well with minimal pain, gradual advancement of diet, and reported bowel movement and passing flatus at time of discharge.  Potassium and magnesium was noted to be low throughout the stay and was repleted as needed.  Encouraged him to continue his vitamin supplementations to make sure his electrolytes remain within normal limits.  Patient also reported the day of discharge that he has been taking Ativan as needed from home.  I reinforced the importance of not taking any unauthorized medications from home for future hospital visits, due to the very high risk of developing drug overlap complications.  He verbalized understanding.  PT eval was attempted throughout his stay due to family request, but due to his electrolyte abnormalities was never to get full assessment.  He has been observed to be walking on his own without any issues.  And patient states that he is feeling well.  I do not believe he needs a full physical therapy assessment at this point since he seems to be able to perform ADLs with no issues.  Can be reconsidered as an outpatient basis if needed by primary.   Consults: None  Discharge Exam: Blood pressure (!) 149/73, pulse 78, temperature 98.2 F (36.8 C), temperature source Oral, resp. rate 20, height 5\' 3"  (1.6 m), weight 90.7 kg, SpO2 95 %. General appearance: alert, cooperative and no distress GI: soft, non-tender; bowel sounds normal; no masses,  no organomegaly port site incisions clean dry and intact.  Bruising noted around the extraction site in right  lower quadrant but no pain obvious induration to indicate an impending infection at this time.  Disposition:  Discharge disposition: 01-Home or Self Care       Discharge Instructions    Discharge patient   Complete by: As directed    Discharge disposition: 01-Home or Self Care   Discharge patient date: 03/21/2020     Allergies as of 03/21/2020      Reactions   Atenolol Other (See Comments)   Mood altering   Metoprolol Other (See Comments)   Mood altering   Tramadol Other (See Comments)   Not metabolized      Medication List    STOP taking these medications   atorvastatin 10 MG tablet Commonly known as: LIPITOR   traMADol 50 MG tablet Commonly known as: Ultram     TAKE these medications   ALPRAZolam 1 MG tablet Commonly known as: XANAX Take 1 mg by mouth 3 (three) times daily as needed for anxiety or sleep.   amLODipine 5 MG tablet Commonly known as: NORVASC Take 5 mg by mouth daily.   carvedilol 12.5 MG tablet Commonly known as: COREG Take 12.5 mg by mouth 2 (two) times daily with a meal.   cetirizine 10 MG chewable tablet Commonly known as: ZYRTEC Chew 10 mg by mouth daily.   cyclobenzaprine 10 MG tablet Commonly known as: FLEXERIL Take 10 mg by mouth 3 (three) times daily as needed for muscle spasms.   docusate sodium 100 MG capsule Commonly known as: COLACE Take 100 mg by mouth daily.   docusate 50 MG/5ML liquid Commonly known as: COLACE Take by mouth  daily.   erythromycin ophthalmic ointment Commonly known as: Romycin Use a small amount on your sutures 4 times a day for the next 2 weeks. Switch to Aquaphor ointment should allergy develop.   folic acid 1 MG tablet Commonly known as: FOLVITE Take 1 tablet (1 mg total) by mouth daily.   gabapentin 100 MG capsule Commonly known as: NEURONTIN Take 200 mg by mouth 3 (three) times daily.   HYDROcodone-acetaminophen 5-325 MG tablet Commonly known as: Norco Take 1 tablet by mouth every 6 (six)  hours as needed for up to 6 doses for severe pain.   ibuprofen 800 MG tablet Commonly known as: ADVIL Take 1 tablet (800 mg total) by mouth 2 (two) times daily as needed for mild pain. Take with meals What changed:   when to take this  reasons to take this  additional instructions   magnesium oxide 400 MG tablet Commonly known as: MAG-OX Take 400 mg by mouth 2 (two) times daily.   multivitamin with minerals Tabs tablet Take 1 tablet by mouth daily.   potassium chloride 10 MEQ CR capsule Commonly known as: MICRO-K   rosuvastatin 10 MG tablet Commonly known as: CRESTOR Take 10 mg by mouth daily.       Follow-up Information    Lysle Pearl, Beau Vanduzer, DO Follow up in 2 week(s).   Specialty: Surgery Why: post op colectomy Contact information: [address] [place] [address] [phone]                Total time spent arranging discharge was >60min. Signed: Benjamine Sprague 03/21/2020, 12:01 PM

## 2020-03-21 NOTE — Progress Notes (Signed)
Erdem S Dubois A and O x4. VSS. Pt tolerating diet well. No complaints of nausea or vomiting. IV removed intact, prescriptions given. Pt voices understanding of discharge instructions with no further questions. Patient discharged via wheelchair with RN, patient persistent he wanted to go to bench outside to wait on his ride which is on it's way.  Allergies as of 03/21/2020      Reactions   Atenolol Other (See Comments)   Mood altering   Metoprolol Other (See Comments)   Mood altering   Tramadol Other (See Comments)   Not metabolized      Medication List    STOP taking these medications   atorvastatin 10 MG tablet Commonly known as: LIPITOR   traMADol 50 MG tablet Commonly known as: Ultram     TAKE these medications   ALPRAZolam 1 MG tablet Commonly known as: XANAX Take 1 mg by mouth 3 (three) times daily as needed for anxiety or sleep.   amLODipine 5 MG tablet Commonly known as: NORVASC Take 5 mg by mouth daily.   carvedilol 12.5 MG tablet Commonly known as: COREG Take 12.5 mg by mouth 2 (two) times daily with a meal.   cetirizine 10 MG chewable tablet Commonly known as: ZYRTEC Chew 10 mg by mouth daily.   cyclobenzaprine 10 MG tablet Commonly known as: FLEXERIL Take 10 mg by mouth 3 (three) times daily as needed for muscle spasms.   docusate sodium 100 MG capsule Commonly known as: COLACE Take 100 mg by mouth daily.   docusate 50 MG/5ML liquid Commonly known as: COLACE Take by mouth daily.   erythromycin ophthalmic ointment Commonly known as: Romycin Use a small amount on your sutures 4 times a day for the next 2 weeks. Switch to Aquaphor ointment should allergy develop.   folic acid 1 MG tablet Commonly known as: FOLVITE Take 1 tablet (1 mg total) by mouth daily.   gabapentin 100 MG capsule Commonly known as: NEURONTIN Take 200 mg by mouth 3 (three) times daily.   HYDROcodone-acetaminophen 5-325 MG tablet Commonly known as: Norco Take 1 tablet by  mouth every 6 (six) hours as needed for up to 6 doses for severe pain.   ibuprofen 800 MG tablet Commonly known as: ADVIL Take 1 tablet (800 mg total) by mouth 2 (two) times daily as needed for mild pain. Take with meals What changed:   when to take this  reasons to take this  additional instructions   magnesium oxide 400 MG tablet Commonly known as: MAG-OX Take 400 mg by mouth 2 (two) times daily.   multivitamin with minerals Tabs tablet Take 1 tablet by mouth daily.   potassium chloride 10 MEQ CR capsule Commonly known as: MICRO-K   rosuvastatin 10 MG tablet Commonly known as: CRESTOR Take 10 mg by mouth daily.       Vitals:   03/21/20 0545 03/21/20 0934  BP: 131/71 (!) 149/73  Pulse: 72 78  Resp: 20   Temp: 98.2 F (36.8 C)   SpO2: 95%     Reace Breshears C Ivonna Kinnick

## 2020-03-21 NOTE — Care Management Important Message (Signed)
Important Message  Patient Details  Name: Todd Beard MRN: [id number] Date of Birth: March 14, 1951   Medicare Important Message Given:  No  Patient discharged prior to arrival to unit to deliver concurrent Medicare IM.    Dannette Barbara 03/21/2020, 2:15 PM

## 2020-03-21 NOTE — Anesthesia Postprocedure Evaluation (Signed)
Anesthesia Post Note  Patient: Todd Beard  Procedure(s) Performed: XI ROBOT ASSISTED SIGMOID COLECTOMY (N/A Abdomen)  Patient location during evaluation: PACU Anesthesia Type: General Level of consciousness: awake and alert Pain management: pain level controlled Vital Signs Assessment: post-procedure vital signs reviewed and stable Respiratory status: spontaneous breathing, nonlabored ventilation and respiratory function stable Cardiovascular status: blood pressure returned to baseline and stable Postop Assessment: no apparent nausea or vomiting Anesthetic complications: no   No complications documented.   Last Vitals:  Vitals:   03/21/20 0545 03/21/20 0934  BP: 131/71 (!) 149/73  Pulse: 72 78  Resp: 20   Temp: 36.8 C   SpO2: 95%     Last Pain:  Vitals:   03/21/20 0934  TempSrc:   PainSc: 0-No pain                 Tera Mater

## 2020-03-21 NOTE — Progress Notes (Signed)
PT Cancellation Note  Patient Details Name: Todd Beard MRN: [id number] DOB: January 02, 1951   Cancelled Treatment:    Reason Eval/Treat Not Completed: Other (comment) Pt seated edge of bed upon arrival to room. Pt reports that he is at baseline of function (ambulation and transfers) and was witnessed ambulating independently in hallway upon arrival to unit. Pt ambulated with clinician and no noted balance deficits at this time. If PT need arises prior to pt d/c, please reconsult.   [name] 03/21/2020, 9:26 AM

## 2020-03-23 LAB — SURGICAL PATHOLOGY

## 2020-03-29 LAB — SURGICAL PATHOLOGY

## 2020-04-11 DIAGNOSIS — R8290 Unspecified abnormal findings in urine: Secondary | ICD-10-CM | POA: Diagnosis not present

## 2020-04-11 DIAGNOSIS — E782 Mixed hyperlipidemia: Secondary | ICD-10-CM | POA: Diagnosis not present

## 2020-04-11 DIAGNOSIS — Z79899 Other long term (current) drug therapy: Secondary | ICD-10-CM | POA: Diagnosis not present

## 2020-04-11 DIAGNOSIS — R739 Hyperglycemia, unspecified: Secondary | ICD-10-CM | POA: Diagnosis not present

## 2020-04-11 DIAGNOSIS — J431 Panlobular emphysema: Secondary | ICD-10-CM | POA: Diagnosis not present

## 2020-04-11 DIAGNOSIS — I10 Essential (primary) hypertension: Secondary | ICD-10-CM | POA: Diagnosis not present

## 2020-07-09 DEATH — deceased
